# Patient Record
Sex: Male | Born: 1985 | Race: Black or African American | Hispanic: No | Marital: Single | Smoking: Current every day smoker
Health system: Southern US, Community
[De-identification: ages and names within clinical notes are randomized; demographics above are authoritative.]

## PROBLEM LIST (undated history)

## (undated) HISTORY — PX: BACK SURGERY: SHX140

---
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
## 2007-10-27 ENCOUNTER — Emergency Department: Payer: Self-pay | Admitting: Emergency Medicine

## 2008-08-18 ENCOUNTER — Emergency Department: Payer: Self-pay | Admitting: Emergency Medicine

## 2008-11-14 ENCOUNTER — Emergency Department: Payer: Self-pay | Admitting: Emergency Medicine

## 2009-01-01 ENCOUNTER — Emergency Department: Payer: Self-pay | Admitting: Emergency Medicine

## 2009-03-29 ENCOUNTER — Emergency Department: Payer: Self-pay | Admitting: Emergency Medicine

## 2009-11-04 IMAGING — CT CT HEAD WITHOUT CONTRAST
2 series · 16 of 30 positions shown, 20 images · non-contrast
Comparison: none

REASON FOR EXAM: head injury with +loc.
COMMENTS:

PROCEDURE:     CT  - CT HEAD WITHOUT CONTRAST  - October 27, 2007  [DATE]
RESULT:     Comparison: No comparison
TECHNIQUE: Multiple axial images from the foramen magnum to the vertex were
obtained without IV contrast.

[Series 2: without · axial · non-contrast · 0.44mm/px · z∈[+392,+522]mm · 13 of 32 slices shown, 17 images]
[im 3/32  brain]
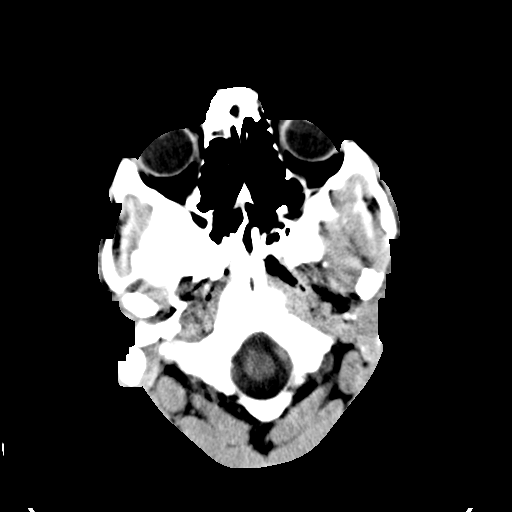
[im 3/32  bone]
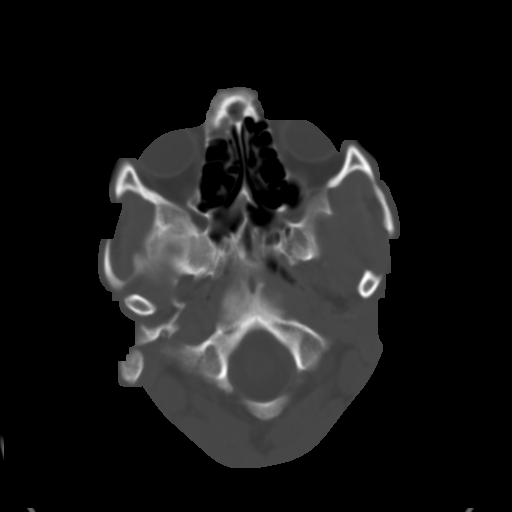
[im 5/32  brain]
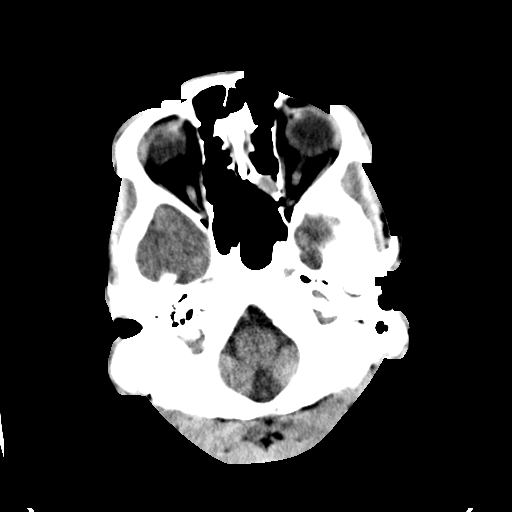
[im 7/32  brain]
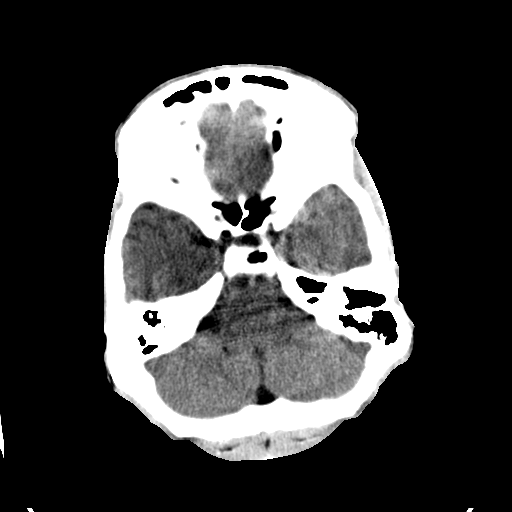
[im 9/32  brain]
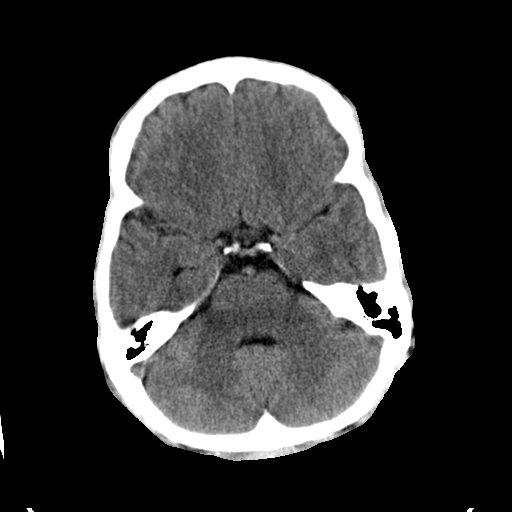
[im 12/32  brain]
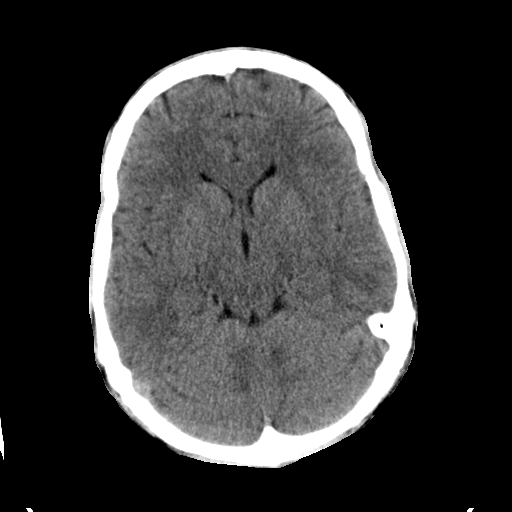
[im 12/32  bone]
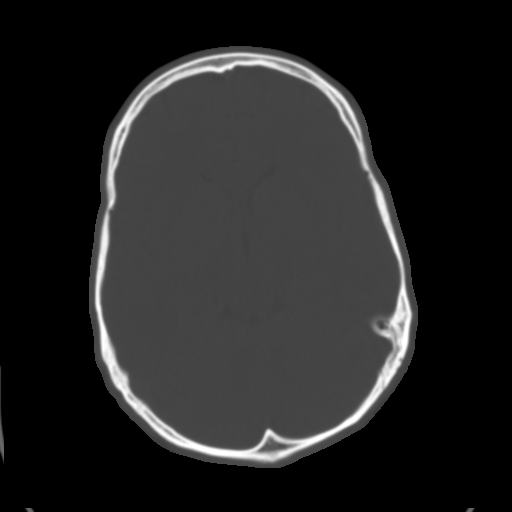
[im 14/32  brain]
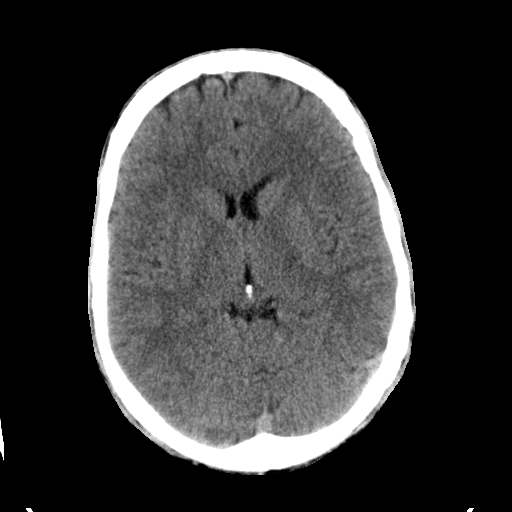
[im 16/32  brain]
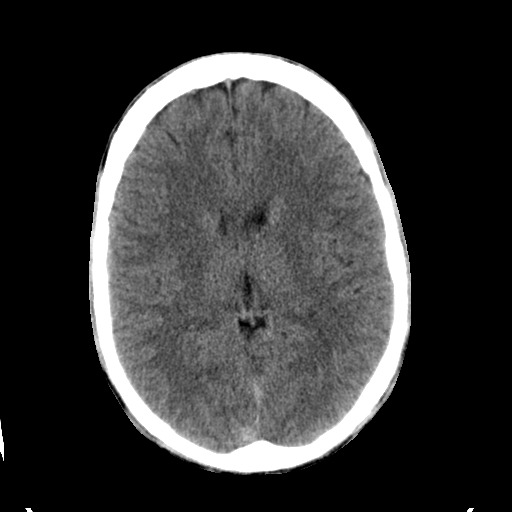
[im 18/32  brain]
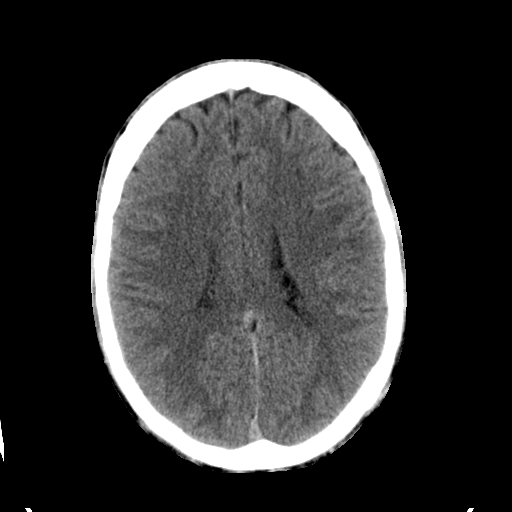
[im 20/32  brain]
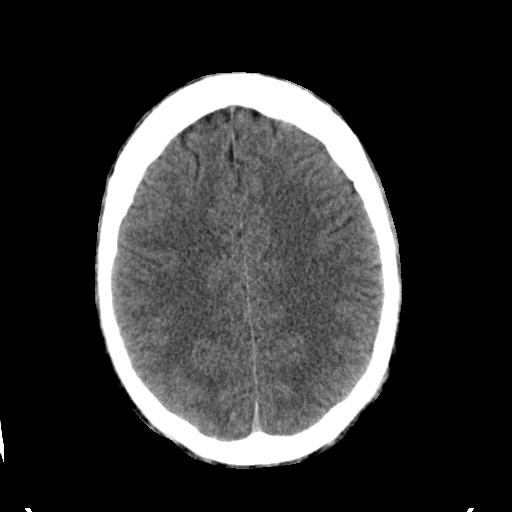
[im 20/32  bone]
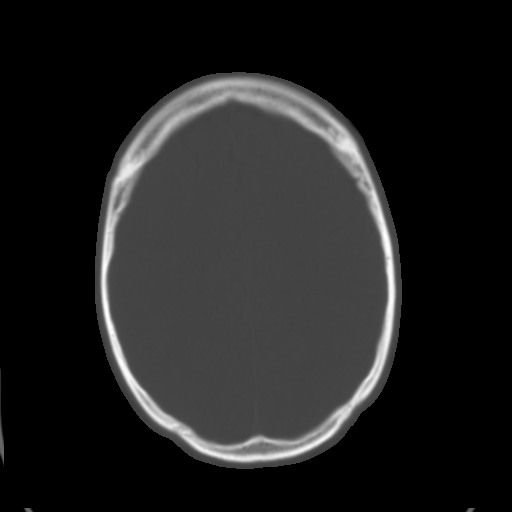
[im 23/32  brain]
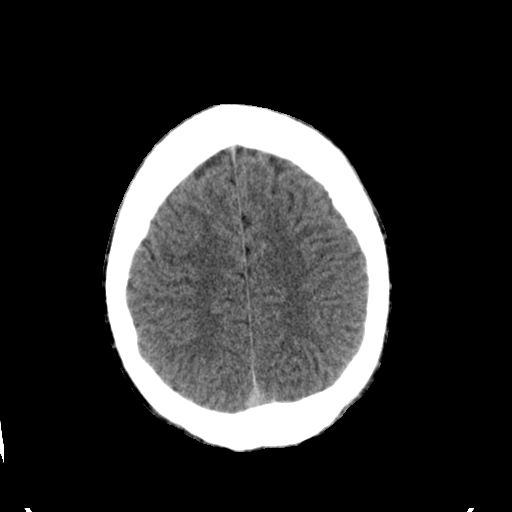
[im 25/32  brain]
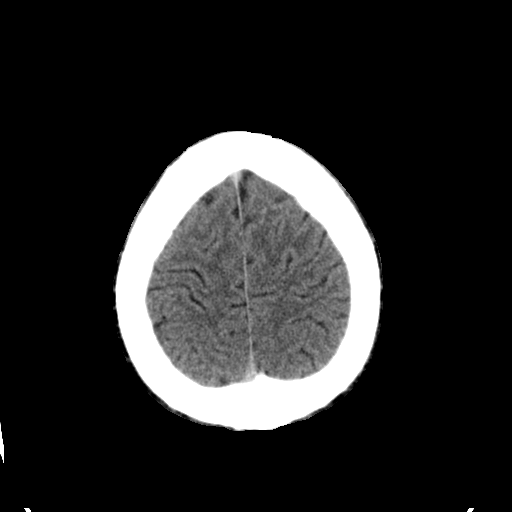
[im 27/32  brain]
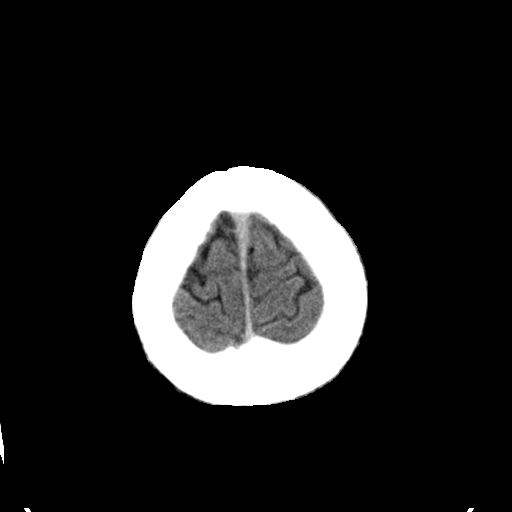
[im 29/32  brain]
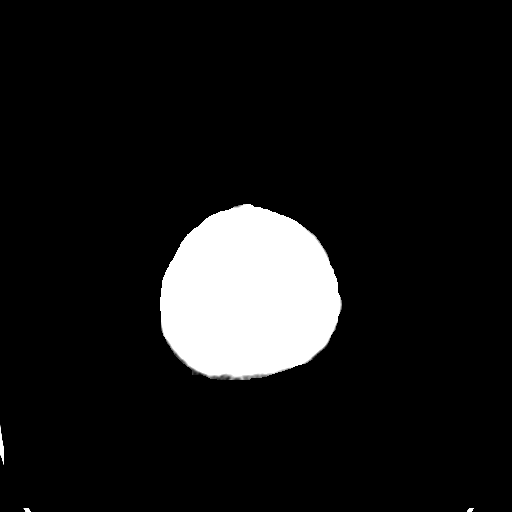
[im 29/32  bone]
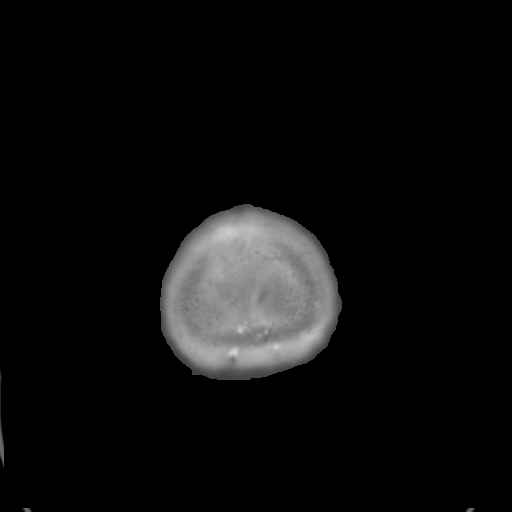

[Series 3: bone · axial · 0.44mm/px · z∈[+392,+437]mm · 3 of 32 slices shown]
[im 3/32  bone]
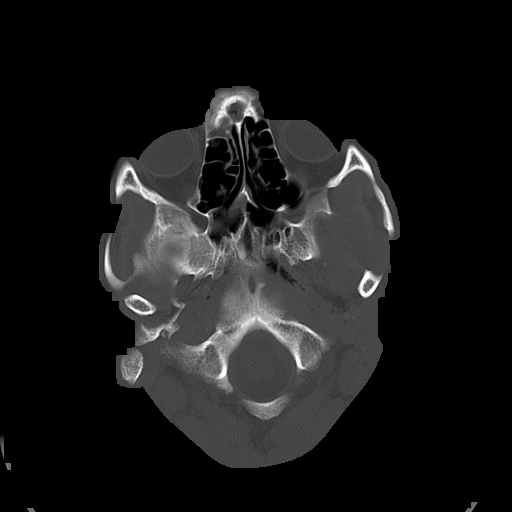
[im 7/32  bone]
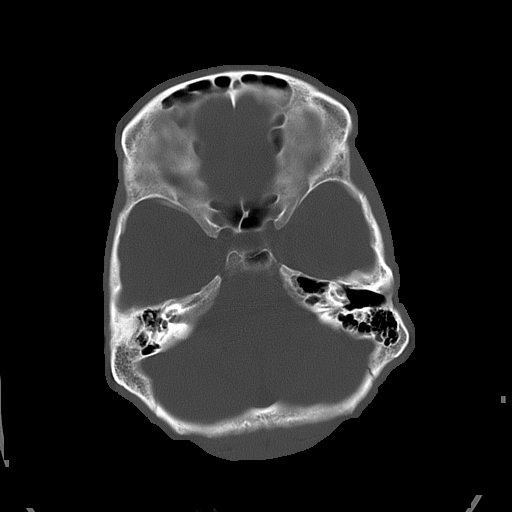
[im 12/32  bone]
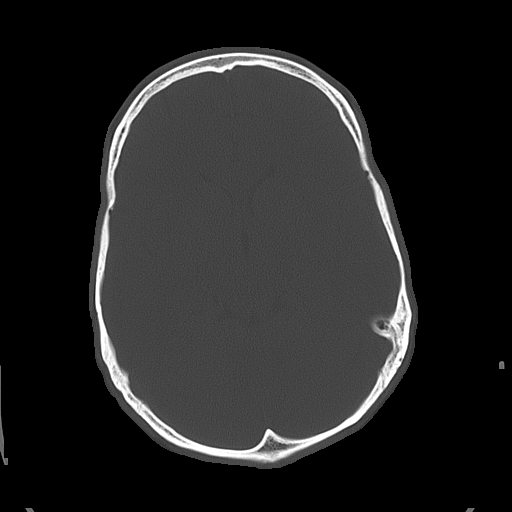

[16 of 30 positions shown; findings below may reference images not displayed]

FINDINGS: There is no evidence for mass effect, midline shift, or extra-axial fluid
collections.  There is no evidence for space-occupying lesion or
intracranial hemorrhage. There is no evidence for cortical-based area of
infarction.

Ventricles and sulci are appropriate for the patient's age. The basal
cisterns are patent.

Visualized portions of the orbits are unremarkable. The paranasal sinuses
and mastoid air cells are unremarkable.

The osseous structures are unremarkable.
IMPRESSION: No acute intracranial process.

## 2010-12-19 ENCOUNTER — Emergency Department: Payer: Self-pay | Admitting: Unknown Physician Specialty

## 2012-03-21 ENCOUNTER — Emergency Department: Payer: Self-pay | Admitting: Emergency Medicine

## 2012-03-24 LAB — BETA STREP CULTURE(ARMC)

## 2012-03-26 ENCOUNTER — Emergency Department: Payer: Self-pay | Admitting: Emergency Medicine

## 2013-11-18 LAB — HM HIV SCREENING LAB: HM HIV Screening: NEGATIVE

## 2014-01-15 ENCOUNTER — Emergency Department: Payer: Self-pay | Admitting: Internal Medicine

## 2014-05-12 ENCOUNTER — Emergency Department
Admit: 2014-05-12 | Disposition: A | Discharge status: Home / Self Care | Payer: Self-pay | Admitting: Emergency Medicine

## 2014-05-12 LAB — CBC WITH DIFFERENTIAL/PLATELET
Basophil #: 0.1 10*3/uL (ref 0.0–0.1)
Basophil %: 0.8 %
Eosinophil #: 0.1 10*3/uL (ref 0.0–0.7)
Eosinophil %: 0.9 %
HCT: 47 % (ref 40.0–52.0)
HGB: 15.1 g/dL (ref 13.0–18.0)
Lymphocyte #: 2.5 10*3/uL (ref 1.0–3.6)
Lymphocyte %: 20.3 %
MCH: 31.1 pg (ref 26.0–34.0)
MCHC: 32.2 g/dL (ref 32.0–36.0)
MCV: 97 fL (ref 80–100)
MONO ABS: 0.6 x10 3/mm (ref 0.2–1.0)
Monocyte %: 5.1 %
Neutrophil #: 9 10*3/uL — ABNORMAL HIGH (ref 1.4–6.5)
Neutrophil %: 72.9 %
PLATELETS: 249 10*3/uL (ref 150–440)
RBC: 4.86 10*6/uL (ref 4.40–5.90)
RDW: 14.3 % (ref 11.5–14.5)
WBC: 12.4 10*3/uL — ABNORMAL HIGH (ref 3.8–10.6)

## 2014-05-12 LAB — COMPREHENSIVE METABOLIC PANEL
ALK PHOS: 59 U/L
Albumin: 5.1 g/dL — ABNORMAL HIGH
Anion Gap: 6 — ABNORMAL LOW (ref 7–16)
BUN: 9 mg/dL
Bilirubin,Total: 0.5 mg/dL
CO2: 30 mmol/L
CREATININE: 1.04 mg/dL
Calcium, Total: 9.5 mg/dL
Chloride: 105 mmol/L
EGFR (Non-African Amer.): >60
Glucose: 110 mg/dL — ABNORMAL HIGH
Potassium: 4.1 mmol/L
SGOT(AST): 58 U/L — ABNORMAL HIGH
SGPT (ALT): 35 U/L
Sodium: 141 mmol/L
Total Protein: 8.1 g/dL

## 2014-05-12 LAB — LIPASE, BLOOD: Lipase: 51 U/L

## 2017-03-26 ENCOUNTER — Encounter: Payer: Self-pay | Admitting: Emergency Medicine

## 2017-03-26 ENCOUNTER — Emergency Department
Admission: EM | Admit: 2017-03-26 | Discharge: 2017-03-26 | Disposition: A | Discharge status: Home / Self Care | Payer: Self-pay | Attending: Emergency Medicine | Admitting: Emergency Medicine

## 2017-03-26 DIAGNOSIS — K2920 Alcoholic gastritis without bleeding: Secondary | ICD-10-CM

## 2017-03-26 DIAGNOSIS — K2900 Acute gastritis without bleeding: Secondary | ICD-10-CM | POA: Insufficient documentation

## 2017-03-26 LAB — URINALYSIS, COMPLETE (UACMP) WITH MICROSCOPIC
Bacteria, UA: NONE SEEN
Bilirubin Urine: NEGATIVE
Glucose, UA: NEGATIVE mg/dL
Hgb urine dipstick: NEGATIVE
Ketones, ur: NEGATIVE mg/dL
Leukocytes, UA: NEGATIVE
Nitrite: NEGATIVE
PH: 5 (ref 5.0–8.0)
Protein, ur: NEGATIVE mg/dL
SPECIFIC GRAVITY, URINE: 1.017 (ref 1.005–1.030)
SQUAMOUS EPITHELIAL / LPF: NONE SEEN

## 2017-03-26 LAB — CBC
HEMATOCRIT: 47.8 % (ref 40.0–52.0)
Hemoglobin: 16.5 g/dL (ref 13.0–18.0)
MCH: 33.4 pg (ref 26.0–34.0)
MCHC: 34.6 g/dL (ref 32.0–36.0)
MCV: 96.6 fL (ref 80.0–100.0)
PLATELETS: 190 10*3/uL (ref 150–440)
RBC: 4.95 MIL/uL (ref 4.40–5.90)
RDW: 13.8 % (ref 11.5–14.5)
WBC: 5 10*3/uL (ref 3.8–10.6)

## 2017-03-26 LAB — COMPREHENSIVE METABOLIC PANEL
ALBUMIN: 4.3 g/dL (ref 3.5–5.0)
ALT: 16 U/L — AB (ref 17–63)
AST: 28 U/L (ref 15–41)
Alkaline Phosphatase: 67 U/L (ref 38–126)
Anion gap: 8 (ref 5–15)
BILIRUBIN TOTAL: 0.6 mg/dL (ref 0.3–1.2)
BUN: 13 mg/dL (ref 6–20)
CHLORIDE: 104 mmol/L (ref 101–111)
CO2: 25 mmol/L (ref 22–32)
CREATININE: 1.04 mg/dL (ref 0.61–1.24)
Calcium: 9.1 mg/dL (ref 8.9–10.3)
GFR calc Af Amer: >60 mL/min (ref >60)
GFR calc non Af Amer: >60 mL/min (ref >60)
GLUCOSE: 89 mg/dL (ref 65–99)
POTASSIUM: 4.6 mmol/L (ref 3.5–5.1)
Sodium: 137 mmol/L (ref 135–145)
Total Protein: 7.3 g/dL (ref 6.5–8.1)

## 2017-03-26 LAB — LIPASE, BLOOD: LIPASE: 27 U/L (ref 11–51)

## 2017-03-26 MED ORDER — OMEPRAZOLE 40 MG PO CPDR: 40.0000 mg | DELAYED_RELEASE_CAPSULE | Freq: Every day | ORAL | 1 refills | Status: DC

## 2017-03-26 MED ORDER — GI COCKTAIL ~~LOC~~
30.0000 mL | Freq: Once | ORAL | Status: AC
  Administered 2017-03-26: 30 mL via ORAL
  Filled 2017-03-26: qty 30

## 2017-03-26 NOTE — ED Notes (Signed)
Pt unable to urinate at this time, given specimen cup for when is able to void and pt send back out to the lobby.

## 2017-03-26 NOTE — ED Triage Notes (Signed)
Pt reports Thursday started with vomitting and abdominal pain. Pt reports now left upper and lower abdomen is still hurting intermittently. Pt reports pain comes more often when he eats.

## 2017-03-26 NOTE — ED Provider Notes (Signed)
[facility] Emergency Department Provider Note   ____________________________________________    I have reviewed the triage vital signs and the nursing notes.   HISTORY  Chief Complaint Abdominal Pain and Emesis     HPI Phillip Barr is a 32 y.o. male who presents with complaints of abdominal pain nausea and vomiting times 4 days.  Patient reports 4 days ago he developed nausea and vomiting and upper abdominal cramping in the epigastrium.  This continued throughout the weekend however over the last 48 hours he has felt significant better.  No nausea or vomiting today.  Still some mild burning discomfort in the epigastrium.  Patient does admit to drinking "a 40 "every night.  No fevers or chills.  Normal stools.  No history of the same.  Has not taken anything for this   History reviewed. No pertinent past medical history.  There are no active problems to display for this patient.   History reviewed. No pertinent surgical history.  Prior to Admission medications   Medication Sig Start Date End Date Taking? Authorizing Provider  omeprazole (PRILOSEC) 40 MG capsule Take 1 capsule (40 mg total) by mouth daily. 03/26/17 03/26/18  Jene Every, MD     Allergies Patient has no allergy information on record.  No family history on file.  Social History Social History   Tobacco Use  . Smoking status: Not on file  Substance Use Topics  . Alcohol use: Not on file  . Drug use: Not on file    Review of Systems  Constitutional: No fever/chills Eyes: No visual changes.  ENT: No sore throat. Cardiovascular: Denies chest pain. Respiratory: Denies shortness of breath. Gastrointestinal: As above Genitourinary: Negative for dysuria. Musculoskeletal: Negative for back pain. Skin: Negative for rash. Neurological: Negative for headaches   ____________________________________________   PHYSICAL EXAM:  VITAL SIGNS: ED Triage Vitals  Enc Vitals  Group     BP 03/26/17 1145 123/73     Pulse Rate 03/26/17 1145 (!) 57     Resp 03/26/17 1145 20     Temp 03/26/17 1145 98 F (36.7 C)     Temp Source 03/26/17 1145 Oral     SpO2 03/26/17 1145 100 %     Weight 03/26/17 1146 54.4 kg (120 lb)     Height 03/26/17 1146 1.676 m (5\' 6" )     Head Circumference --      Peak Flow --      Pain Score 03/26/17 1146 5     Pain Loc --      Pain Edu? --      Excl. in GC? --     Constitutional: Alert and oriented. No acute distress. Pleasant and interactive Eyes: Conjunctivae are normal.   Nose: No congestion/rhinnorhea. Mouth/Throat: Mucous membranes are moist.    Cardiovascular: Normal rate, regular rhythm. Grossly normal heart sounds.  Good peripheral circulation. Respiratory: Normal respiratory effort.  No retractions. Lungs CTAB. Gastrointestinal: Minimal epigastric tenderness. No distention.  No CVA tenderness. Genitourinary: deferred Musculoskeletal:  Warm and well perfused Neurologic:  Normal speech and language. No gross focal neurologic deficits are appreciated.  Skin:  Skin is warm, dry and intact. No rash noted. Psychiatric: Mood and affect are normal. Speech and behavior are normal.  ____________________________________________   LABS (all labs ordered are listed, but only abnormal results are displayed)  Labs Reviewed  COMPREHENSIVE METABOLIC PANEL - Abnormal; Notable for the following components:      Result Value   ALT  16 (*)    All other components within normal limits  URINALYSIS, COMPLETE (UACMP) WITH MICROSCOPIC - Abnormal; Notable for the following components:   Color, Urine YELLOW (*)    APPearance CLEAR (*)    All other components within normal limits  LIPASE, BLOOD  CBC   ____________________________________________  EKG  None ____________________________________________  RADIOLOGY  None ____________________________________________   PROCEDURES  Procedure(s) performed:  No  Procedures   Critical Care performed: No ____________________________________________   INITIAL IMPRESSION / ASSESSMENT AND PLAN / ED COURSE  Pertinent labs & imaging results that were available during my care of the patient were reviewed by me and considered in my medical decision making (see chart for details).  Patient symptoms are consistent with gastritis versus pancreatitis, especially given continued alcohol use.  Lipase is unremarkable and patient had resolution of pain with GI cocktail.  He reports he has a strong appetite now and would like to go home.  Counseled alcohol cessation will start PPI    ____________________________________________   FINAL CLINICAL IMPRESSION(S) / ED DIAGNOSES  Final diagnoses:  Acute alcoholic gastritis without hemorrhage        Note:  This document was prepared using Dragon voice recognition software and may include unintentional dictation errors.    Jene EveryKinner, Annica Marinello, MD 03/26/17 1251

## 2017-03-26 NOTE — ED Notes (Signed)
ED Provider at bedside. 

## 2017-08-15 ENCOUNTER — Other Ambulatory Visit: Payer: Self-pay

## 2017-08-15 ENCOUNTER — Encounter: Payer: Self-pay | Admitting: Emergency Medicine

## 2017-08-15 ENCOUNTER — Emergency Department
Admission: EM | Admit: 2017-08-15 | Discharge: 2017-08-16 | Disposition: A | Discharge status: Home / Self Care | Payer: Self-pay | Attending: Emergency Medicine | Admitting: Emergency Medicine

## 2017-08-15 DIAGNOSIS — G43909 Migraine, unspecified, not intractable, without status migrainosus: Secondary | ICD-10-CM | POA: Insufficient documentation

## 2017-08-15 DIAGNOSIS — F17200 Nicotine dependence, unspecified, uncomplicated: Secondary | ICD-10-CM | POA: Insufficient documentation

## 2017-08-15 NOTE — ED Triage Notes (Signed)
Patient ambulatory to triage with steady gait, without difficulty or distress noted; pt reports frontal HA accomp by nausea x 6hrs; st hx HA as a child

## 2017-08-16 ENCOUNTER — Emergency Department: Payer: Self-pay

## 2017-08-16 LAB — COMPREHENSIVE METABOLIC PANEL
ALBUMIN: 4.1 g/dL (ref 3.5–5.0)
ALK PHOS: 50 U/L (ref 38–126)
ALT: 19 U/L (ref 0–44)
ANION GAP: 8 (ref 5–15)
AST: 23 U/L (ref 15–41)
BUN: 10 mg/dL (ref 6–20)
CALCIUM: 9 mg/dL (ref 8.9–10.3)
CHLORIDE: 108 mmol/L (ref 98–111)
CO2: 27 mmol/L (ref 22–32)
Creatinine, Ser: 0.8 mg/dL (ref 0.61–1.24)
GFR calc Af Amer: >60 mL/min (ref >60)
GFR calc non Af Amer: >60 mL/min (ref >60)
Glucose, Bld: 113 mg/dL — ABNORMAL HIGH (ref 70–99)
POTASSIUM: 3.8 mmol/L (ref 3.5–5.1)
SODIUM: 143 mmol/L (ref 135–145)
Total Bilirubin: 0.6 mg/dL (ref 0.3–1.2)
Total Protein: 7.2 g/dL (ref 6.5–8.1)

## 2017-08-16 LAB — CBC WITH DIFFERENTIAL/PLATELET
BASOS PCT: 0 %
Basophils Absolute: 0 10*3/uL (ref 0–0.1)
EOS PCT: 1 %
Eosinophils Absolute: 0.1 10*3/uL (ref 0–0.7)
HCT: 39.5 % — ABNORMAL LOW (ref 40.0–52.0)
Hemoglobin: 13.5 g/dL (ref 13.0–18.0)
LYMPHS ABS: 1.3 10*3/uL (ref 1.0–3.6)
Lymphocytes Relative: 14 %
MCH: 32.3 pg (ref 26.0–34.0)
MCHC: 34.3 g/dL (ref 32.0–36.0)
MCV: 94.1 fL (ref 80.0–100.0)
MONO ABS: 0.6 10*3/uL (ref 0.2–1.0)
MONOS PCT: 7 %
Neutro Abs: 7.2 10*3/uL — ABNORMAL HIGH (ref 1.4–6.5)
Neutrophils Relative %: 78 %
PLATELETS: 226 10*3/uL (ref 150–440)
RBC: 4.2 MIL/uL — ABNORMAL LOW (ref 4.40–5.90)
RDW: 13.8 % (ref 11.5–14.5)
WBC: 9.3 10*3/uL (ref 3.8–10.6)

## 2017-08-16 LAB — LIPASE, BLOOD: Lipase: 25 U/L (ref 11–51)

## 2017-08-16 MED ORDER — KETOROLAC TROMETHAMINE 30 MG/ML IJ SOLN
15.0000 mg | Freq: Once | INTRAMUSCULAR | Status: AC
  Administered 2017-08-16: 15 mg via INTRAVENOUS
  Filled 2017-08-16: qty 1

## 2017-08-16 MED ORDER — DIPHENHYDRAMINE HCL 50 MG/ML IJ SOLN
12.5000 mg | INTRAMUSCULAR | Status: AC
  Administered 2017-08-16: 12.5 mg via INTRAVENOUS
  Filled 2017-08-16: qty 1

## 2017-08-16 MED ORDER — METOCLOPRAMIDE HCL 5 MG/ML IJ SOLN
10.0000 mg | INTRAMUSCULAR | Status: AC
Start: 2017-08-16 — End: 2017-08-16
  Administered 2017-08-16: 10 mg via INTRAVENOUS
  Filled 2017-08-16: qty 2

## 2017-08-16 MED ORDER — SODIUM CHLORIDE 0.9 % IV BOLUS
1000.0000 mL | Freq: Once | INTRAVENOUS | Status: AC
  Administered 2017-08-16: 1000 mL via INTRAVENOUS

## 2017-08-16 MED ORDER — DEXAMETHASONE SODIUM PHOSPHATE 10 MG/ML IJ SOLN
10.0000 mg | Freq: Once | INTRAMUSCULAR | Status: AC
  Administered 2017-08-16: 10 mg via INTRAVENOUS
  Filled 2017-08-16: qty 1

## 2017-08-16 NOTE — Discharge Instructions (Signed)

## 2017-08-16 NOTE — ED Provider Notes (Signed)
[facility] Emergency Department Provider Note  ____________________________________________   First MD Initiated Contact with Patient 08/15/17 2343     (approximate)  I have reviewed the triage vital signs and the nursing notes.   HISTORY  Chief Complaint Headache    HPI Phillip Barr is a 32 y.o. male who reports no chronic history and presents by private vehicle for evaluation of acute onset severe frontal headache that is sharp and stabbing and throbbing accompanied with persistent nausea.  The symptoms started about 6 hours prior to arrival.  He reports that he took a nap this afternoon and felt fine before him but he awoke with the symptoms as described.  He reports numerous episodes of vomiting since awakening, at least a dozen.  He is not having any abdominal pain.  He has no numbness nor weakness in any of his extremities, no difficulty with ambulation, no difficulty with word finding, no facial droop.  He reports having migraines that feels similar to this but milder when he was a child but has not had any issues for years.  He denies recent fever/chills, chest pain, shortness of breath, abdominal pain, and dysuria.  He smokes a small amount of cigarettes every day and marijuana, no other drugs, rare alcohol.  No recent traumatic injuries to the head.  Light makes his headache worse and he is keeping his eyes closed tightly during most of the interview.  No neck pain nor stiffness.  History reviewed. No pertinent past medical history.  There are no active problems to display for this patient.   Past Surgical History:  Procedure Laterality Date  . BACK SURGERY      Prior to Admission medications   Medication Sig Start Date End Date Taking? Authorizing Provider  omeprazole (PRILOSEC) 40 MG capsule Take 1 capsule (40 mg total) by mouth daily. 03/26/17 03/26/18  Jene EveryKinner, Robert, MD    Allergies Patient has no known allergies.  No family history on  file.  Social History Social History   Tobacco Use  . Smoking status: Current Every Day Smoker  . Smokeless tobacco: Never Used  Substance Use Topics  . Alcohol use: Yes  . Drug use: Not on file    Review of Systems Constitutional: No fever/chills Eyes: Photophobia. ENT: No sore throat. Cardiovascular: Denies chest pain. Respiratory: Denies shortness of breath. Gastrointestinal: No abdominal pain.  Acute onset severe nausea. Genitourinary: Negative for dysuria. Musculoskeletal: Negative for neck pain.  Negative for back pain. Integumentary: Negative for rash. Neurological: Severe frontal headache as described above.  No focal numbness nor weakness.   ____________________________________________   PHYSICAL EXAM:  VITAL SIGNS: ED Triage Vitals [08/15/17 2302]  Enc Vitals Group     BP 134/84     Pulse Rate 77     Resp 18     Temp 98 F (36.7 C)     Temp Source Oral     SpO2 97 %     Weight 56.7 kg (125 lb)     Height 1.702 m (5\' 7" )     Head Circumference      Peak Flow      Pain Score 10     Pain Loc      Pain Edu?      Excl. in GC?     Constitutional: Alert and oriented.  Appears uncomfortable but is not in severe distress. Eyes: Conjunctivae are normal.  Mild anisocoria with left pupil smaller than the right, but both are  reactive to light. Head: Atraumatic. Nose: No congestion/rhinnorhea. Mouth/Throat: Mucous membranes are moist. Neck: No stridor.  No meningeal signs.   Cardiovascular: Normal rate, regular rhythm. Good peripheral circulation. Grossly normal heart sounds. Respiratory: Normal respiratory effort.  No retractions. Lungs CTAB. Gastrointestinal: Soft and nontender. No distention.  Musculoskeletal: No lower extremity tenderness nor edema. No gross deformities of extremities. Neurologic:  Normal speech and language. No gross focal neurologic deficits are appreciated.  Patient has good muscle strength throughout his extremities and no evidence of  any cerebellar dysfunction.  No evidence of visual droop. Skin:  Skin is warm, dry and intact. No rash noted. Psychiatric: Mood and affect are normal. Speech and behavior are normal.  ____________________________________________   LABS (all labs ordered are listed, but only abnormal results are displayed)  Labs Reviewed  CBC WITH DIFFERENTIAL/PLATELET - Abnormal; Notable for the following components:      Result Value   RBC 4.20 (*)    HCT 39.5 (*)    Neutro Abs 7.2 (*)    All other components within normal limits  COMPREHENSIVE METABOLIC PANEL - Abnormal; Notable for the following components:   Glucose, Bld 113 (*)    All other components within normal limits  LIPASE, BLOOD   ____________________________________________  EKG  None - EKG not ordered by ED physician ____________________________________________  RADIOLOGY   ED MD interpretation:  No acute intracranial abnormalities  Official radiology report(s): Ct Head Wo Contrast  Result Date: 08/16/2017 CLINICAL DATA:  Worst headache of life EXAM: CT HEAD WITHOUT CONTRAST TECHNIQUE: Contiguous axial images were obtained from the base of the skull through the vertex without intravenous contrast. COMPARISON:  Head CT 10/27/2007 FINDINGS: Brain: There is no mass, hemorrhage or extra-axial collection. The size and configuration of the ventricles and extra-axial CSF spaces are normal. There is no acute or chronic infarction. The brain parenchyma is normal. Vascular: No abnormal hyperdensity of the major intracranial arteries or dural venous sinuses. No intracranial atherosclerosis. Skull: The visualized skull base, calvarium and extracranial soft tissues are normal. Sinuses/Orbits: No fluid levels or advanced mucosal thickening of the visualized paranasal sinuses. No mastoid or middle ear effusion. The orbits are normal. IMPRESSION: Normal head CT. Electronically Signed   By: Deatra Robinson M.D.   On: 08/16/2017 01:08     ____________________________________________   PROCEDURES  Critical Care performed: No   Procedure(s) performed:   Procedures   ____________________________________________   INITIAL IMPRESSION / ASSESSMENT AND PLAN / ED COURSE  As part of my medical decision making, I reviewed the following data within the electronic MEDICAL RECORD NUMBER Nursing notes reviewed and incorporated, Labs reviewed , Old chart reviewed and Notes from prior ED visits    Differential diagnosis includes, but is not limited to, migraine headache, cluster headache, nonspecific generalized headache, infectious process including but not limited to meningitis or encephalitis, metabolic or electrolyte abnormality, nontraumatic subarachnoid hemorrhage.  Given the patient's mild anisocoria and the acute onset of the symptoms, I think it is reasonable to check a CT head without contrast.  However most likely patient is suffering from a migraine.  I will begin treatment with 1 L normal saline IV, metoclopramide 10 mg IV, Benadryl 12.5 mg IV.  Once I have a normal head CT I will give him Toradol 15 mg IV and reassess.  I told the patient the plan and explained for what I am evaluating and that hopefully he will be able to go home if everything looks normal and he is feeling  better.  He agrees with the plan.  I am also checking basic lab work including hepatic function panel and lipase given that he is having severe nausea, but fortunately has no tenderness to palpation of the abdomen I think it is very unlikely he is suffering from biliary colic or an SBO/ileus.   Clinical Course as of Aug 16 404  Thu Aug 16, 2017  0155 Normal head CT with no evidence of acute bleeding.  Labs are all within normal limits.  I am giving Toradol 50 mg IV and Decadron 10 mg IV to help with the migraine symptoms  CT Head Wo Contrast [CF]  0405 Headache is a 0 and the patient is ready to go home.  I gave my usual and customary return  precautions.   [CF]    Clinical Course User Index [CF] Loleta Rose, MD    ____________________________________________  FINAL CLINICAL IMPRESSION(S) / ED DIAGNOSES  Final diagnoses:  Migraine without status migrainosus, not intractable, unspecified migraine type     MEDICATIONS GIVEN DURING THIS VISIT:  Medications  metoCLOPramide (REGLAN) injection 10 mg (10 mg Intravenous Given 08/16/17 0133)  diphenhydrAMINE (BENADRYL) injection 12.5 mg (12.5 mg Intravenous Given 08/16/17 0133)  sodium chloride 0.9 % bolus 1,000 mL (0 mLs Intravenous Stopped 08/16/17 0250)  ketorolac (TORADOL) 30 MG/ML injection 15 mg (15 mg Intravenous Given 08/16/17 0214)  dexamethasone (DECADRON) injection 10 mg (10 mg Intravenous Given 08/16/17 0214)     ED Discharge Orders    None       Note:  This document was prepared using Dragon voice recognition software and may include unintentional dictation errors.    Loleta Rose, MD 08/16/17 [phone]

## 2018-09-25 ENCOUNTER — Other Ambulatory Visit: Payer: Self-pay

## 2018-09-25 ENCOUNTER — Ambulatory Visit: Payer: Self-pay | Admitting: Physician Assistant

## 2018-09-25 DIAGNOSIS — Z113 Encounter for screening for infections with a predominantly sexual mode of transmission: Secondary | ICD-10-CM

## 2018-09-25 LAB — GRAM STAIN

## 2018-09-26 ENCOUNTER — Encounter: Payer: Self-pay | Admitting: Physician Assistant

## 2018-09-26 NOTE — Progress Notes (Signed)
    STI clinic/screening visit  Subjective:  Phillip Barr is a 33 y.o. male being seen today for an STI screening visit. The patient reports they do not have symptoms.  Patient has the following medical conditions:  There are no active problems to display for this patient.    Chief Complaint  Patient presents with  . SEXUALLY TRANSMITTED DISEASE    HPI  Patient reports that he would like a screening.  Denies any symptoms today.    See flowsheet for further details and programmatic requirements.    The following portions of the patient's history were reviewed and updated as appropriate: allergies, current medications, past medical history, past social history, past surgical history and problem list.  Objective:  There were no vitals filed for this visit.  Physical Exam Constitutional:      General: He is not in acute distress.    Appearance: Normal appearance.  HENT:     Head: Normocephalic and atraumatic.     Mouth/Throat:     Mouth: Mucous membranes are moist.     Pharynx: Oropharynx is clear. No oropharyngeal exudate or posterior oropharyngeal erythema.  Eyes:     Conjunctiva/sclera: Conjunctivae normal.  Neck:     Musculoskeletal: Neck supple.  Pulmonary:     Effort: Pulmonary effort is normal.  Abdominal:     Palpations: Abdomen is soft. There is no mass.     Tenderness: There is no abdominal tenderness. There is no guarding or rebound.  Genitourinary:    Penis: Normal.      Scrotum/Testes: Normal.     Comments: Pubic area without nits, lice, edema, erythema, lesions and inguinal adenopathy. Lymphadenopathy:     Cervical: No cervical adenopathy.  Skin:    General: Skin is warm and dry.     Findings: No bruising, erythema, lesion or rash.  Neurological:     Mental Status: He is alert and oriented to person, place, and time.  Psychiatric:        Mood and Affect: Mood normal.        Behavior: Behavior normal.        Thought Content: Thought content  normal.        Judgment: Judgment normal.       Assessment and Plan:  Phillip Barr is a 33 y.o. male presenting to the [facility] Department for STI screening  1. Screening for STD (sexually transmitted disease) Patient is without symptoms today. Rec condoms with all sex Await test results.  Counseled that RN will call if needs to RTC for treatment once results are back.  - Gram stain - Gonococcus culture - HIV [facility] - Syphilis Serology, [facility] - Gonococcus culture     No follow-ups on file.  No future appointments.  Jerene Dilling, PA

## 2018-09-30 LAB — GONOCOCCUS CULTURE

## 2019-02-13 ENCOUNTER — Ambulatory Visit: Payer: Self-pay | Admitting: Physician Assistant

## 2019-02-13 ENCOUNTER — Other Ambulatory Visit: Payer: Self-pay

## 2019-02-13 ENCOUNTER — Encounter: Payer: Self-pay | Admitting: Physician Assistant

## 2019-02-13 DIAGNOSIS — Z113 Encounter for screening for infections with a predominantly sexual mode of transmission: Secondary | ICD-10-CM

## 2019-02-13 DIAGNOSIS — Z202 Contact with and (suspected) exposure to infections with a predominantly sexual mode of transmission: Secondary | ICD-10-CM

## 2019-02-13 LAB — GRAM STAIN

## 2019-02-13 MED ORDER — AZITHROMYCIN 500 MG PO TABS
1000.0000 mg | ORAL_TABLET | Freq: Once | ORAL | Status: AC
  Administered 2019-02-13: 12:00:00 1000 mg via ORAL

## 2019-02-13 NOTE — Progress Notes (Signed)
Pt is here for STD screening. Pt reports he is a contact to Chlamydia.Lyman Speller, RN

## 2019-02-13 NOTE — Progress Notes (Signed)
Gram stain reviewed and is negative today. Pt treated as a contact to Chlamydia per Sadie Haber, PA order. Provider orders completed.Lyman Speller, RN

## 2019-02-15 NOTE — Progress Notes (Signed)
   [facility] Department STI clinic/screening visit  Subjective:  Phillip Barr is a 34 y.o. male being seen today for an STI screening visit. The patient reports they do have symptoms.    Patient has the following medical conditions:  There are no problems to display for this patient.    Chief Complaint  Patient presents with  . SEXUALLY TRANSMITTED DISEASE    STD screening and contact to Chlamydia    HPI  Patient reports that he is a contact to Chlamydia.  States that he has had slight dysuria for a week off and on.  Denies other symptoms.   See flowsheet for further details and programmatic requirements.    The following portions of the patient's history were reviewed and updated as appropriate: allergies, current medications, past medical history, past social history, past surgical history and problem list.  Objective:  There were no vitals filed for this visit.  Physical Exam Constitutional:      General: He is not in acute distress.    Appearance: Normal appearance. He is normal weight.  HENT:     Head: Normocephalic and atraumatic.     Comments: No nits, lice, or hair loss. No cervical, supraclavicular or axillary adenopathy.    Mouth/Throat:     Mouth: Mucous membranes are moist.     Pharynx: Oropharynx is clear. No oropharyngeal exudate or posterior oropharyngeal erythema.  Eyes:     Conjunctiva/sclera: Conjunctivae normal.  Pulmonary:     Effort: Pulmonary effort is normal.  Abdominal:     Palpations: Abdomen is soft. There is no mass.     Tenderness: There is no abdominal tenderness. There is no guarding or rebound.  Genitourinary:    Penis: Normal.      Testes: Normal.     Comments: Pubic area without nits, lice, edema, erythema, lesions and inguinal adenopathy. Penis circumcised, without lesions, rashes, and discharge from urethral meatus. Musculoskeletal:     Cervical back: Neck supple. No tenderness.  Skin:    General: Skin is warm  and dry.     Findings: No bruising, erythema, lesion or rash.  Neurological:     Mental Status: He is alert and oriented to person, place, and time.  Psychiatric:        Mood and Affect: Mood normal.        Behavior: Behavior normal.        Thought Content: Thought content normal.        Judgment: Judgment normal.       Assessment and Plan:  Phillip Barr is a 34 y.o. male presenting to the [facility] Department for STI screening  1. Screening for STD (sexually transmitted disease) Patient into clinic with symptoms. Rec condoms with all sex. Await test results.  Counseled that RN will call if needs to RTC for further treatment once results are back. - Gram stain - Gonococcus culture - HIV/HCV [facility] - Syphilis Serology, [facility]  2. Chlamydia contact Treat as a contact to Chlamydia with Azithromycin 1g po DOT. No sex for 7 days and until after partner completes treatment. RTC for re-treatment if vomits < 2 hr after taking medicine. - azithromycin (ZITHROMAX) tablet 1,000 mg     Return if symptoms worsen or fail to improve.  No future appointments.  Matt Holmes, PA

## 2019-02-18 LAB — GONOCOCCUS CULTURE

## 2019-02-19 LAB — HM HIV SCREENING LAB: HM HIV Screening: NEGATIVE

## 2019-02-19 LAB — HM HEPATITIS C SCREENING LAB: HM Hepatitis Screen: NEGATIVE

## 2019-05-05 ENCOUNTER — Emergency Department: Admission: EM | Admit: 2019-05-05 | Discharge: 2019-05-05 | Discharge status: Against Medical Advice | Payer: Self-pay

## 2019-05-05 NOTE — ED Notes (Signed)
Called for patient without answer to triage

## 2019-05-05 NOTE — ED Notes (Signed)
PT called for triage with no response

## 2019-05-05 NOTE — ED Triage Notes (Signed)
Pt called for triage no response ?

## 2019-05-30 ENCOUNTER — Ambulatory Visit: Payer: Self-pay | Admitting: Physician Assistant

## 2019-05-30 ENCOUNTER — Other Ambulatory Visit: Payer: Self-pay

## 2019-05-30 DIAGNOSIS — Z113 Encounter for screening for infections with a predominantly sexual mode of transmission: Secondary | ICD-10-CM

## 2019-05-30 DIAGNOSIS — Z792 Long term (current) use of antibiotics: Secondary | ICD-10-CM

## 2019-05-30 LAB — GRAM STAIN

## 2019-05-30 MED ORDER — AZITHROMYCIN 500 MG PO TABS
1000.0000 mg | ORAL_TABLET | Freq: Once | ORAL | Status: AC
Start: 2019-05-30 — End: 2019-05-30
  Administered 2019-05-30: 1000 mg via ORAL

## 2019-05-30 NOTE — Progress Notes (Signed)
Gram stain reviewed by provider, pt treated per provider orders. Provider orders completed.

## 2019-05-31 ENCOUNTER — Encounter: Payer: Self-pay | Admitting: Physician Assistant

## 2019-05-31 NOTE — Progress Notes (Signed)
   [facility] Department STI clinic/screening visit  Subjective:  Phillip Barr is a 34 y.o. male being seen today for an STI screening visit. The patient reports they do have symptoms.    Patient has the following medical conditions:  There are no problems to display for this patient.    Chief Complaint  Patient presents with  . SEXUALLY TRANSMITTED DISEASE    screening including bloodwork    HPI  Patient reports that he has had a "burning/tingling" inside his urethra for 2 days.  Denies other symptoms.  Denies chronic conditions and regular medications.  Reports "scoliosis surgery" at age 85.    See flowsheet for further details and programmatic requirements.    The following portions of the patient's history were reviewed and updated as appropriate: allergies, current medications, past medical history, past social history, past surgical history and problem list.  Objective:  There were no vitals filed for this visit.  Physical Exam Constitutional:      General: He is not in acute distress.    Appearance: Normal appearance. He is normal weight.  HENT:     Head: Normocephalic and atraumatic.     Comments: No nits, lice, or hair loss. No cervical, supraclavicular or axillary adenopathy.    Mouth/Throat:     Mouth: Mucous membranes are moist.     Pharynx: Oropharynx is clear. No oropharyngeal exudate or posterior oropharyngeal erythema.  Eyes:     Conjunctiva/sclera: Conjunctivae normal.  Pulmonary:     Effort: Pulmonary effort is normal.  Abdominal:     Palpations: Abdomen is soft. There is no mass.     Tenderness: There is no abdominal tenderness. There is no guarding or rebound.  Genitourinary:    Penis: Normal.      Testes: Normal.     Comments: Pubic area without nits, lice, edema, erythema, lesions and inguinal adenopathy. Penis circumcised and without rash, lesions and discharge from meatus.  Musculoskeletal:     Cervical back: Neck supple.  No tenderness.  Skin:    General: Skin is warm and dry.     Findings: No bruising, erythema, lesion or rash.  Neurological:     Mental Status: He is alert and oriented to person, place, and time.  Psychiatric:        Mood and Affect: Mood normal.        Behavior: Behavior normal.        Thought Content: Thought content normal.        Judgment: Judgment normal.       Assessment and Plan:  Phillip Barr is a 34 y.o. male presenting to the [facility] Department for STI screening  1. Screening for STD (sexually transmitted disease) Patient into clinic with symptoms. Rec condoms with all sex. Await test results.  Counseled that RN will call if needs to RTC for further treatment once results are back.  - Gram stain - Gonococcus culture - HIV [facility] - Syphilis Serology, [facility] - Gonococcus culture  2. Prophylactic antibiotic Will treat due to symptoms and risks with Azithromycin 1 g po DOT today. No sex for 7 days and until after partner completes treatment. RTC for re-treatment if vomits < 2 hr after taking medicine. - azithromycin (ZITHROMAX) tablet 1,000 mg     No follow-ups on file.  No future appointments.  Matt Holmes, PA

## 2019-06-03 LAB — GONOCOCCUS CULTURE

## 2019-10-29 ENCOUNTER — Ambulatory Visit: Payer: Self-pay

## 2020-01-21 ENCOUNTER — Encounter: Payer: Self-pay | Admitting: Physician Assistant

## 2020-01-21 ENCOUNTER — Other Ambulatory Visit: Payer: Self-pay

## 2020-01-21 DIAGNOSIS — Z20822 Contact with and (suspected) exposure to covid-19: Secondary | ICD-10-CM

## 2020-01-23 LAB — NOVEL CORONAVIRUS, NAA: SARS-CoV-2, NAA: NOT DETECTED

## 2020-01-23 LAB — SARS-COV-2, NAA 2 DAY TAT

## 2020-05-31 ENCOUNTER — Emergency Department
Admission: EM | Admit: 2020-05-31 | Discharge: 2020-05-31 | Disposition: A | Discharge status: Home / Self Care | Payer: Self-pay | Attending: Emergency Medicine | Admitting: Emergency Medicine

## 2020-05-31 ENCOUNTER — Other Ambulatory Visit: Payer: Self-pay

## 2020-05-31 ENCOUNTER — Encounter: Payer: Self-pay | Admitting: *Deleted

## 2020-05-31 DIAGNOSIS — J069 Acute upper respiratory infection, unspecified: Secondary | ICD-10-CM | POA: Insufficient documentation

## 2020-05-31 DIAGNOSIS — Z87891 Personal history of nicotine dependence: Secondary | ICD-10-CM | POA: Insufficient documentation

## 2020-05-31 DIAGNOSIS — Z28310 Unvaccinated for covid-19: Secondary | ICD-10-CM | POA: Insufficient documentation

## 2020-05-31 DIAGNOSIS — Z20822 Contact with and (suspected) exposure to covid-19: Secondary | ICD-10-CM | POA: Insufficient documentation

## 2020-05-31 DIAGNOSIS — Z8616 Personal history of COVID-19: Secondary | ICD-10-CM | POA: Insufficient documentation

## 2020-05-31 LAB — RESP PANEL BY RT-PCR (FLU A&B, COVID) ARPGX2
Influenza A by PCR: NEGATIVE
Influenza B by PCR: NEGATIVE
SARS Coronavirus 2 by RT PCR: NEGATIVE

## 2020-05-31 MED ORDER — CETIRIZINE HCL 5 MG PO TABS: 5.0000 mg | ORAL_TABLET | Freq: Every day | ORAL | 0 refills | Status: AC

## 2020-05-31 MED ORDER — ONDANSETRON 4 MG PO TBDP: 4.0000 mg | ORAL_TABLET | Freq: Three times a day (TID) | ORAL | 0 refills | Status: DC | PRN

## 2020-05-31 MED ORDER — BENZONATATE 100 MG PO CAPS: ORAL_CAPSULE | ORAL | 0 refills | Status: DC

## 2020-05-31 NOTE — ED Notes (Signed)
See triage note  Presents with cough and some congestion for couple of days    Also has a h/a  No fever

## 2020-05-31 NOTE — ED Triage Notes (Signed)
Pt has a cough, headache, congestion.  Sx for 1 day.   Pt alert  Speech clear.

## 2020-05-31 NOTE — Discharge Instructions (Signed)
Your exam is normal at this time. Take the prescription meds as directed. Follow-up with [facility] as needed.

## 2020-05-31 NOTE — ED Provider Notes (Signed)
[facility] Emergency Department Provider Note ____________________________________________  Time seen: 1730  I have reviewed the triage vital signs and the nursing notes.  HISTORY  Chief Complaint  Cough   HPI Phillip Barr is a 35 y.o. male presents himself to the ED for evaluation of 1 week complaint of cough and congestion as well as some intermittent headache.  Patient denies any frank fevers, but has noted some intermittent chills and night sweats.  Today he experienced 2 episodes of nonbloody, nonbilious emesis after eating.  He denies any ongoing abdominal pain, but is also had some intermittent diarrhea in the last week.  Patient denies any sick contacts, recent travel, or bad food exposure.  He does confirm he was diagnosed with COVID in January.  He has not subsequently received a COVID and or flu vaccine.   No past medical history on file.  There are no problems to display for this patient.   Past Surgical History:  Procedure Laterality Date  . BACK SURGERY      Prior to Admission medications   Medication Sig Start Date End Date Taking? Authorizing Provider  benzonatate (TESSALON PERLES) 100 MG capsule Take 1-2 tabs TID prn cough 05/31/20  Yes Bryson Gavia, Charlesetta Ivory, PA-C  cetirizine (ZYRTEC) 5 MG tablet Take 1 tablet (5 mg total) by mouth daily for 15 days. 05/31/20 06/15/20 Yes Taline Nass, Charlesetta Ivory, PA-C  ondansetron (ZOFRAN ODT) 4 MG disintegrating tablet Take 1 tablet (4 mg total) by mouth every 8 (eight) hours as needed. 05/31/20  Yes Jeanean Hollett, Charlesetta Ivory, PA-C  omeprazole (PRILOSEC) 40 MG capsule Take 1 capsule (40 mg total) by mouth daily. 03/26/17 03/26/18  Jene Every, MD    Allergies Patient has no known allergies.  No family history on file.  Social History Social History   Tobacco Use  . Smoking status: Former Games developer  . Smokeless tobacco: Never Used  . Tobacco comment: quit about 2 years ago  Substance Use Topics  .  Alcohol use: Yes  . Drug use: Not Currently    Types: Marijuana    Review of Systems  Constitutional: Negative for fever. Eyes: Negative for visual changes. ENT: Negative for sore throat. Cardiovascular: Negative for chest pain. Respiratory: Negative for shortness of breath. Gastrointestinal: Negative for abdominal pain, vomiting and diarrhea. Genitourinary: Negative for dysuria. Musculoskeletal: Negative for back pain. Skin: Negative for rash. Neurological: Negative for headaches, focal weakness or numbness. ____________________________________________  PHYSICAL EXAM:  VITAL SIGNS: ED Triage Vitals  Enc Vitals Group     BP 05/31/20 1607 122/74     Pulse Rate 05/31/20 1607 70     Resp 05/31/20 1607 16     Temp 05/31/20 1607 98.3 F (36.8 C)     Temp Source 05/31/20 1607 Oral     SpO2 05/31/20 1607 98 %     Weight 05/31/20 1611 115 lb (52.2 kg)     Height 05/31/20 1611 5\' 6"  (1.676 m)     Head Circumference --      Peak Flow --      Pain Score 05/31/20 1611 4     Pain Loc --      Pain Edu? --      Excl. in GC? --     Constitutional: Alert and oriented. Well appearing and in no distress. Head: Normocephalic and atraumatic. Eyes: Conjunctivae are normal. PERRL. Normal extraocular movements Ears: Canals clear. TMs intact bilaterally. Nose: No congestion/rhinorrhea/epistaxis. Mouth/Throat: Mucous membranes are moist. Neck:  Supple. No thyromegaly. Hematological/Lymphatic/Immunological: No cervical lymphadenopathy. Cardiovascular: Normal rate, regular rhythm. Normal distal pulses. Respiratory: Normal respiratory effort. No wheezes/rales/rhonchi. Gastrointestinal: Soft and nontender. No distention. Musculoskeletal: Nontender with normal range of motion in all extremities.  Neurologic:  Normal gait without ataxia. Normal speech and language. No gross focal neurologic deficits are appreciated. Skin:  Skin is warm, dry and intact. No rash noted. Psychiatric: Mood and  affect are normal. Patient exhibits appropriate insight and judgment. ____________________________________________   LABS (pertinent positives/negatives) Labs Reviewed  RESP PANEL BY RT-PCR (FLU A&B, COVID) [id number]  ____________________________________________  PROCEDURES  Procedures ____________________________________________   INITIAL IMPRESSION / ASSESSMENT AND PLAN / ED COURSE  As part of my medical decision making, I reviewed the following data within the electronic MEDICAL RECORD NUMBER Notes from prior ED visits      Patient ED evaluation of now resolved nausea and vomiting, as well as some intermittent cough and congestion.  Patient exam is overall benign reassurance time.  Vital signs are stable as he has no signs of acute respiratory stress, tachycardia, tachypnea, or hypoxia.  Patient's exam is reassuring as it also shows no signs of an acute illness at this time.  Clinically the patient is stable no symptoms may represent a viral etiology.  He has agreed to viral panel screen, which is pending at the time of his disposition.  Patient is otherwise stable for discharge at this time.  He will be discharged with instructions to take the prescription Tessalon Perles, cetirizine, and ondansetron as needed.  He will follow-up with his primary provider or return to the ED if necessary.    Phillip Barr was evaluated in Emergency Department on 06/01/2020 for the symptoms described in the history of present illness. He was evaluated in the context of the global COVID-19 pandemic, which necessitated consideration that the patient might be at risk for infection with the SARS-CoV-2 virus that causes COVID-19. Institutional protocols and algorithms that pertain to the evaluation of patients at risk for COVID-19 are in a state of rapid change based on information released by regulatory bodies including the CDC and federal and state organizations. These policies and algorithms were followed during the  patient's care in the ED. ____________________________________________  FINAL CLINICAL IMPRESSION(S) / ED DIAGNOSES  Final diagnoses:  Viral URI with cough      Phillip Barr, Charlesetta Ivory, PA-C 06/01/20 Corene Cornea, MD 06/01/20 1513

## 2020-07-15 ENCOUNTER — Encounter: Payer: Self-pay | Admitting: Family Medicine

## 2020-07-15 ENCOUNTER — Ambulatory Visit: Payer: Self-pay | Admitting: Family Medicine

## 2020-07-15 ENCOUNTER — Other Ambulatory Visit: Payer: Self-pay

## 2020-07-15 DIAGNOSIS — Z113 Encounter for screening for infections with a predominantly sexual mode of transmission: Secondary | ICD-10-CM

## 2020-07-15 LAB — GRAM STAIN

## 2020-07-15 NOTE — Progress Notes (Signed)
Pt here for STD screening.  Gram Stain results reviewed. No treatment required.  Pt given condoms. Berdie Ogren, RN

## 2020-07-15 NOTE — Progress Notes (Signed)
   [facility] Department STI clinic/screening visit  Subjective:  Phillip Barr is a 35 y.o. male being seen today for an STI screening visit. The patient reports they do not have symptoms.    Patient has the following medical conditions:  There are no problems to display for this patient.    Chief Complaint  Patient presents with   SEXUALLY TRANSMITTED DISEASE    Screening    HPI  Patient reports here for screening, denies s/sx   See flowsheet for further details and programmatic requirements.    The following portions of the patient's history were reviewed and updated as appropriate: allergies, current medications, past medical history, past social history, past surgical history and problem list.  Objective:  There were no vitals filed for this visit.  Physical Exam Constitutional:      Appearance: Normal appearance.  HENT:     Head: Normocephalic.     Mouth/Throat:     Mouth: Mucous membranes are moist.     Pharynx: Oropharynx is clear. No oropharyngeal exudate.  Pulmonary:     Effort: Pulmonary effort is normal.  Genitourinary:    Penis: Normal.      Testes: Normal.     Comments: No lice, nits, or pest, no lesions or odor discharge.  Denies pain or tenderness with paplation of testicles.  No lesions, ulcers or masses present.    Musculoskeletal:     Cervical back: Normal range of motion and neck supple.  Lymphadenopathy:     Cervical: No cervical adenopathy.  Skin:    General: Skin is warm and dry.     Findings: No bruising, erythema, lesion or rash.  Neurological:     Mental Status: He is alert and oriented to person, place, and time.  Psychiatric:        Mood and Affect: Mood normal.        Behavior: Behavior normal.      Assessment and Plan:  Phillip Barr is a 35 y.o. male presenting to the [facility] Department for STI screening  1. Screening examination for venereal disease  - Gonococcus culture - Gram stain -  HBV Antigen/[facility]ody [facility] - HIV/HCV [facility] - Syphilis Serology, [facility] - Gonococcus culture Patient does not have STI symptoms Patient accepted all screenings including  gram stain, oral, urethral GC and bloodwork for HIV/RPR.  Patient meets criteria for HepB screening? Yes. Ordered? Yes Patient meets criteria for HepC screening? Yes. Ordered? Yes Recommended condom use with all sex Discussed importance of condom use for STI prevent  Treat gram stain per standing order Discussed time line for [facility] results and that patient will be called with positive results and encouraged patient to call if he had not heard in 2 weeks Recommended returning for continued or worsening symptoms.      Return for as needed.  No future appointments.  Wendi Snipes, FNP

## 2020-07-19 LAB — HM HIV SCREENING LAB: HM HIV Screening: NEGATIVE

## 2020-07-19 LAB — HM HEPATITIS C SCREENING LAB: HM Hepatitis Screen: NEGATIVE

## 2020-07-21 LAB — GONOCOCCUS CULTURE

## 2021-02-01 ENCOUNTER — Emergency Department: Payer: Self-pay

## 2021-02-01 ENCOUNTER — Emergency Department
Admission: EM | Admit: 2021-02-01 | Discharge: 2021-02-02 | Disposition: A | Discharge status: Home / Self Care | Payer: Self-pay | Attending: Emergency Medicine | Admitting: Emergency Medicine

## 2021-02-01 ENCOUNTER — Other Ambulatory Visit: Payer: Self-pay

## 2021-02-01 ENCOUNTER — Encounter: Payer: Self-pay | Admitting: Emergency Medicine

## 2021-02-01 DIAGNOSIS — M25531 Pain in right wrist: Secondary | ICD-10-CM | POA: Insufficient documentation

## 2021-02-01 MED ORDER — MELOXICAM 15 MG PO TABS: 15.0000 mg | ORAL_TABLET | Freq: Every day | ORAL | 1 refills | Status: AC

## 2021-02-01 NOTE — ED Provider Notes (Signed)
[facility] Provider Note  Patient Contact: 11:27 PM (approximate)   History   Wrist Pain   HPI  Phillip Barr is a 36 y.o. male  presents to the ED with right wrist pain along the first dorsal extensor compartment. Patient engages in repetitive activities at work. No numbness or tingling in the right hand. No similar injuries in the past.       Physical Exam   Triage Vital Signs: ED Triage Vitals  Enc Vitals Group     BP 02/01/21 2218 120/71     Pulse Rate 02/01/21 2218 61     Resp 02/01/21 2218 18     Temp 02/01/21 2218 98 F (36.7 C)     Temp Source 02/01/21 2218 Oral     SpO2 02/01/21 2218 96 %     Weight 02/01/21 2216 122 lb (55.3 kg)     Height 02/01/21 2216 5\' 7"  (1.702 m)     Head Circumference --      Peak Flow --      Pain Score 02/01/21 2216 7     Pain Loc --      Pain Edu? --      Excl. in GC? --     Most recent vital signs: Vitals:   02/01/21 2218  BP: 120/71  Pulse: 61  Resp: 18  Temp: 98 F (36.7 C)  SpO2: 96%     General: Alert and in no acute distress. Eyes:  PERRL. EOMI. Head: No acute traumatic findings ENT:      Ears:       Nose: No congestion/rhinnorhea.      Mouth/Throat: Mucous membranes are moist. Neck: No stridor. No cervical spine tenderness to palpation. Cardiovascular:  Good peripheral perfusion Respiratory: Normal respiratory effort without tachypnea or retractions. Lungs CTAB. Good air entry to the bases with no decreased or absent breath sounds. Gastrointestinal: Bowel sounds 4 quadrants. Soft and nontender to palpation. No guarding or rigidity. No palpable masses. No distention. No CVA tenderness. Musculoskeletal: Full range of motion to all extremities. Positive Finkelstein test on the right. Palpable radial and ulnar pulses bilaterally and symmetrically. Neurologic:  No gross focal neurologic deficits are appreciated.  Skin:   No rash noted Other:   ED Results / Procedures / Treatments    Labs (all labs ordered are listed, but only abnormal results are displayed) Labs Reviewed - No data to display      RADIOLOGY  I personally viewed and evaluated these images as part of my medical decision making, as well as reviewing the written report by the radiologist.  ED Provider Interpretation: No acute bony abnormality at the right wrist.      MEDICATIONS ORDERED IN ED: Medications - No data to display   IMPRESSION / MDM / ASSESSMENT AND PLAN / ED COURSE  I reviewed the triage vital signs and the nursing notes.                              Differential diagnosis includes, but is not limited to, De Quervain's Tenosynovitis, Carpal Tunnel, wrist contusion...  Assessment and Plan: Wrist pain:  36 year old male presents with right wrist pain. No bony abnormality on xray. Patient was discharged with Meloxicam and a wrist brace was provided. Follow up referral to ortho given.       FINAL CLINICAL IMPRESSION(S) / ED DIAGNOSES   Final diagnoses:  Right wrist  pain     Rx / DC Orders   ED Discharge Orders          Ordered    meloxicam (MOBIC) 15 MG tablet  Daily        02/01/21 2324             Note:  This document was prepared using Dragon voice recognition software and may include unintentional dictation errors.   Pia Mau [place], PA-C 02/01/21 2333    Phineas Semen, MD 02/01/21 2351

## 2021-02-01 NOTE — Discharge Instructions (Addendum)
Take Meloxicam once daily.  Wear splint at night.

## 2021-02-01 NOTE — ED Triage Notes (Signed)
Pt arrived via POV with reports of R wrist pain x 3-4 days, denies injury, states there was swelling, but has improved.  Feels like something grinding.

## 2021-03-17 ENCOUNTER — Other Ambulatory Visit: Payer: Self-pay

## 2021-03-17 ENCOUNTER — Emergency Department
Admission: EM | Admit: 2021-03-17 | Discharge: 2021-03-17 | Disposition: A | Discharge status: Home / Self Care | Payer: No Typology Code available for payment source | Attending: Emergency Medicine | Admitting: Emergency Medicine

## 2021-03-17 ENCOUNTER — Encounter: Payer: Self-pay | Admitting: Emergency Medicine

## 2021-03-17 DIAGNOSIS — R1013 Epigastric pain: Secondary | ICD-10-CM | POA: Diagnosis present

## 2021-03-17 LAB — URINALYSIS, ROUTINE W REFLEX MICROSCOPIC
Bilirubin Urine: NEGATIVE
Glucose, UA: NEGATIVE mg/dL
Hgb urine dipstick: NEGATIVE
Ketones, ur: 5 mg/dL — AB
Leukocytes,Ua: NEGATIVE
Nitrite: NEGATIVE
Protein, ur: NEGATIVE mg/dL
Specific Gravity, Urine: 1.01 (ref 1.005–1.030)
pH: 5 (ref 5.0–8.0)

## 2021-03-17 LAB — CBC
HCT: 46.6 % (ref 39.0–52.0)
Hemoglobin: 15.2 g/dL (ref 13.0–17.0)
MCH: 31.5 pg (ref 26.0–34.0)
MCHC: 32.6 g/dL (ref 30.0–36.0)
MCV: 96.7 fL (ref 80.0–100.0)
Platelets: 254 10*3/uL (ref 150–400)
RBC: 4.82 MIL/uL (ref 4.22–5.81)
RDW: 13.4 % (ref 11.5–15.5)
WBC: 5.7 10*3/uL (ref 4.0–10.5)
nRBC: 0 % (ref 0.0–0.2)

## 2021-03-17 LAB — COMPREHENSIVE METABOLIC PANEL
ALT: 20 U/L (ref 0–44)
AST: 29 U/L (ref 15–41)
Albumin: 4.7 g/dL (ref 3.5–5.0)
Alkaline Phosphatase: 48 U/L (ref 38–126)
Anion gap: 9 (ref 5–15)
BUN: 15 mg/dL (ref 6–20)
CO2: 24 mmol/L (ref 22–32)
Calcium: 9.2 mg/dL (ref 8.9–10.3)
Chloride: 100 mmol/L (ref 98–111)
Creatinine, Ser: 1.03 mg/dL (ref 0.61–1.24)
GFR, Estimated: >60 mL/min (ref >60)
Glucose, Bld: 144 mg/dL — ABNORMAL HIGH (ref 70–99)
Potassium: 4.6 mmol/L (ref 3.5–5.1)
Sodium: 133 mmol/L — ABNORMAL LOW (ref 135–145)
Total Bilirubin: 0.8 mg/dL (ref 0.3–1.2)
Total Protein: 7.8 g/dL (ref 6.5–8.1)

## 2021-03-17 LAB — LIPASE, BLOOD: Lipase: 28 U/L (ref 11–51)

## 2021-03-17 MED ORDER — ALUM & MAG HYDROXIDE-SIMETH 200-200-20 MG/5ML PO SUSP
30.0000 mL | Freq: Once | ORAL | Status: AC
  Administered 2021-03-17: 30 mL via ORAL
  Filled 2021-03-17: qty 30

## 2021-03-17 MED ORDER — PANTOPRAZOLE SODIUM 40 MG PO TBEC: 40.0000 mg | DELAYED_RELEASE_TABLET | Freq: Every day | ORAL | 1 refills | Status: AC

## 2021-03-17 MED ORDER — LIDOCAINE VISCOUS HCL 2 % MT SOLN
15.0000 mL | Freq: Once | OROMUCOSAL | Status: AC
  Administered 2021-03-17: 15 mL via ORAL
  Filled 2021-03-17: qty 15

## 2021-03-17 NOTE — ED Provider Notes (Addendum)
[facility] Provider Note    Event Date/Time   First MD Initiated Contact with Patient 03/17/21 1058     (approximate)   History   Abdominal Pain   HPI  Phillip Barr is a 36 y.o. male who complains of epigastric pain occasionally radiating up into the chest and throat.  He is reports it feels like gastritis he had before.  There is no fever nausea vomiting or diarrhea.  Sometimes eating some foods makes it worse has been going on for about 3 days since he drank a lot of alcohol over the weekend.      Physical Exam   Triage Vital Signs: ED Triage Vitals [03/17/21 0935]  Enc Vitals Group     BP 128/75     Pulse Rate 74     Resp 18     Temp 97.6 F (36.4 C)     Temp Source Oral     SpO2 98 %     Weight 125 lb (56.7 kg)     Height 5\' 6"  (1.676 m)     Head Circumference      Peak Flow      Pain Score 6     Pain Loc      Pain Edu?      Excl. in GC?     Most recent vital signs: Vitals:   03/17/21 0935  BP: 128/75  Pulse: 74  Resp: 18  Temp: 97.6 F (36.4 C)  SpO2: 98%    General: Awake, no distress.  CV:  Good peripheral perfusion.  Heart regular rate and rhythm no audible murmurs Resp:  Normal effort.  Lungs are clear Abd:  No distention.  Abdomen soft nontender except for in the epigastric area just to the right of it.  Palpation reproduces his pain exactly.    ED Results / Procedures / Treatments   Labs (all labs ordered are listed, but only abnormal results are displayed) Labs Reviewed  COMPREHENSIVE METABOLIC PANEL - Abnormal; Notable for the following components:      Result Value   Sodium 133 (*)    Glucose, Bld 144 (*)    All other components within normal limits  URINALYSIS, ROUTINE W REFLEX MICROSCOPIC - Abnormal; Notable for the following components:   Color, Urine STRAW (*)    APPearance CLEAR (*)    Ketones, ur 5 (*)    All other components within normal limits  LIPASE, BLOOD  CBC      EKG     RADIOLOGY    PROCEDURES:  Critical Care performed:   Procedures   MEDICATIONS ORDERED IN ED: Medications  alum & mag hydroxide-simeth (MAALOX/MYLANTA) 200-200-20 MG/5ML suspension 30 mL (30 mLs Oral Given 03/17/21 1129)    And  lidocaine (XYLOCAINE) 2 % viscous mouth solution 15 mL (15 mLs Oral Given 03/17/21 1129)     IMPRESSION / MDM / ASSESSMENT AND PLAN / ED COURSE  I reviewed the triage vital signs and the nursing notes.  Patient has normal liver functions normal white count normal H&H and a normal lipase.  His symptoms remind him of gastritis and they improved markedly with GI cocktail.  This makes it most likely that he has gastritis.  His symptoms are not made worse with exertion.  I considered admission but there is nothing to admit this gentleman for.  I will give the gentleman GI cocktail equivalent that is Protonix 40 mg once a day.  If need be for the  next couple days he can take Maalox Mylanta or Tums.  Most likely diagnosis is gastritis.  He also could have an ulcer.  Heart disease or pancreatitis or cholecystitis or hepatitis I would very unlikely based on the presentation results of the lab work and GI cocktail and his past history.      FINAL CLINICAL IMPRESSION(S) / ED DIAGNOSES   Final diagnoses:  Epigastric pain     Rx / DC Orders   ED Discharge Orders          Ordered    pantoprazole (PROTONIX) 40 MG tablet  Daily        03/17/21 1157             Note:  This document was prepared using Dragon voice recognition software and may include unintentional dictation errors.   Arnaldo Natal, MD 03/17/21 1212    Arnaldo Natal, MD 03/17/21 1212

## 2021-03-17 NOTE — ED Triage Notes (Signed)
Pt here with abd pain for a few days. Pt states he has a hx of gastritis and believes it is a flare up. Pt states pain is in his upper abd. Pt denies N/V/D. Pt in NAD in triage.

## 2021-03-17 NOTE — Discharge Instructions (Addendum)
Take the Protonix 1 pill a day.  This should help with the gastritis symptoms and you should feel better after 3 to 4 days.  In the meantime you can take Maalox or Mylanta or Tums up to every 2 hours if it helps.  Avoid foods that irritate your stomach; usually these are citrusy acidy or spicy foods.  Milk may help.  Please return if you are not improving after 3 or 4 days or if you get worse or cannot keep any thing down.

## 2021-05-16 ENCOUNTER — Emergency Department
Admission: EM | Admit: 2021-05-16 | Discharge: 2021-05-16 | Disposition: A | Discharge status: Home / Self Care | Payer: No Typology Code available for payment source | Attending: Emergency Medicine | Admitting: Emergency Medicine

## 2021-05-16 DIAGNOSIS — H109 Unspecified conjunctivitis: Secondary | ICD-10-CM | POA: Insufficient documentation

## 2021-05-16 MED ORDER — POLYMYXIN B-TRIMETHOPRIM 10000-0.1 UNIT/ML-% OP SOLN: 2.0000 [drp] | Freq: Four times a day (QID) | OPHTHALMIC | 0 refills | Status: DC

## 2021-05-16 MED ORDER — POLYMYXIN B-TRIMETHOPRIM 10000-0.1 UNIT/ML-% OP SOLN
2.0000 [drp] | Freq: Four times a day (QID) | OPHTHALMIC | Status: DC
  Administered 2021-05-16: 2 [drp] via OPHTHALMIC
  Filled 2021-05-16: qty 10

## 2021-05-16 NOTE — ED Provider Notes (Signed)
? ?[facility] ?Provider Note ? ?Patient Contact: 8:01 PM (approximate) ? ? ?History  ? ?Conjunctivitis ? ? ?HPI ? ?Phillip Barr is a 36 y.o. male who presents to the emergency department with his son who has similar symptoms.  Patient is complaining of right eye pain, erythema and drainage.  Son is complaining both eyes being involved.  No other symptoms or complaints.  No medications prior to arrival.  No vision changes. ?  ? ? ?Physical Exam  ? ?Triage Vital Signs: ?ED Triage Vitals [05/16/21 1914]  ?Enc Vitals Group  ?   BP 113/66  ?   Pulse Rate 64  ?   Resp 14  ?   Temp 98.6 ?F (37 ?C)  ?   Temp Source Oral  ?   SpO2 96 %  ?   Weight   ?   Height   ?   Head Circumference   ?   Peak Flow   ?   Pain Score   ?   Pain Loc   ?   Pain Edu?   ?   Excl. in GC?   ? ? ?Most recent vital signs: ?Vitals:  ? 05/16/21 1914  ?BP: 113/66  ?Pulse: 64  ?Resp: 14  ?Temp: 98.6 ?F (37 ?C)  ?SpO2: 96%  ? ? ? ?General: Alert and in no acute distress. ?Eyes:  PERRL. EOMI. right eye conjunctival erythema.  Purulent discharge noted on the eyelashes.  Left eye is unremarkable.  Funduscopic exam is reassuring.  ?Cardiovascular:  Good peripheral perfusion ?Respiratory: Normal respiratory effort without tachypnea or retractions. Lungs CTAB. ?Musculoskeletal: Full range of motion to all extremities.  ?Neurologic:  No gross focal neurologic deficits are appreciated.  ?Skin:   No rash noted ?Other: ? ? ?ED Results / Procedures / Treatments  ? ?Labs ?(all labs ordered are listed, but only abnormal results are displayed) ?Labs Reviewed - No data to display ? ? ?EKG ? ? ? ? ?RADIOLOGY ? ? ? ?No results found. ? ?PROCEDURES: ? ?Critical Care performed: No ? ?Procedures ? ? ?MEDICATIONS ORDERED IN ED: ?Medications  ?trimethoprim-polymyxin b (POLYTRIM) ophthalmic solution 2 drop (has no administration in time range)  ? ? ? ?IMPRESSION / MDM / ASSESSMENT AND PLAN / ED COURSE  ?I reviewed the triage vital signs and the  nursing notes. ?             ?               ? ?Differential diagnosis includes, but is not limited to, conjunctivitis, (viral, allergic, bacterial, chemical) ? ? ?Patient's diagnosis is consistent with bacterial conjunctivitis.  Patient presents with right eye irritation and drainage.  Patient's son is also being seen with similar symptoms.  Findings on exam are consistent with bacterial conjunctivitis.  Antibiotic Polytrim was started in the ED.  Follow-up with primary care or ophthalmology as needed..  Patient is given ED precautions to return to the ED for any worsening or new symptoms. ? ? ? ?  ? ? ?FINAL CLINICAL IMPRESSION(S) / ED DIAGNOSES  ? ?Final diagnoses:  ?Bacterial conjunctivitis of right eye  ? ? ? ?Rx / DC Orders  ? ?ED Discharge Orders   ? ?      Ordered  ?  trimethoprim-polymyxin b (POLYTRIM) ophthalmic solution  Every 6 hours       ? 05/16/21 2003  ? ?  ?  ? ?  ? ? ? ?Note:  This document was  prepared using Conservation officer, historic buildings and may include unintentional dictation errors. ?  ?Racheal Patches, PA-C ?05/16/21 2005 ? ?  ?Minna Antis, MD ?05/16/21 2315 ? ?

## 2021-05-16 NOTE — ED Triage Notes (Signed)
Pt presents c/o eye drainage and redness x3 days.  ?

## 2021-07-18 ENCOUNTER — Emergency Department: Payer: Self-pay

## 2021-07-18 ENCOUNTER — Other Ambulatory Visit: Payer: Self-pay

## 2021-07-18 ENCOUNTER — Encounter: Payer: Self-pay | Admitting: Emergency Medicine

## 2021-07-18 ENCOUNTER — Emergency Department
Admission: EM | Admit: 2021-07-18 | Discharge: 2021-07-18 | Disposition: A | Discharge status: Home / Self Care | Payer: Self-pay | Attending: Emergency Medicine | Admitting: Emergency Medicine

## 2021-07-18 DIAGNOSIS — M461 Sacroiliitis, not elsewhere classified: Secondary | ICD-10-CM | POA: Insufficient documentation

## 2021-07-18 DIAGNOSIS — M545 Low back pain, unspecified: Secondary | ICD-10-CM

## 2021-07-18 MED ORDER — CYCLOBENZAPRINE HCL 10 MG PO TABS: 10.0000 mg | ORAL_TABLET | Freq: Three times a day (TID) | ORAL | 0 refills | Status: DC | PRN

## 2021-07-18 MED ORDER — LIDOCAINE 5 % EX PTCH: 1.0000 | MEDICATED_PATCH | Freq: Two times a day (BID) | CUTANEOUS | 0 refills | Status: AC

## 2021-08-08 ENCOUNTER — Ambulatory Visit: Payer: Self-pay | Admitting: Internal Medicine

## 2021-10-06 ENCOUNTER — Emergency Department
Admission: EM | Admit: 2021-10-06 | Discharge: 2021-10-06 | Disposition: A | Discharge status: Home / Self Care | Payer: Self-pay | Attending: Emergency Medicine | Admitting: Emergency Medicine

## 2021-10-06 ENCOUNTER — Encounter: Payer: Self-pay | Admitting: Intensive Care

## 2021-10-06 ENCOUNTER — Other Ambulatory Visit: Payer: Self-pay

## 2021-10-06 DIAGNOSIS — Z20822 Contact with and (suspected) exposure to covid-19: Secondary | ICD-10-CM | POA: Insufficient documentation

## 2021-10-06 DIAGNOSIS — B349 Viral infection, unspecified: Secondary | ICD-10-CM | POA: Insufficient documentation

## 2021-10-06 LAB — RESP PANEL BY RT-PCR (FLU A&B, COVID) ARPGX2
Influenza A by PCR: NEGATIVE
Influenza B by PCR: NEGATIVE
SARS Coronavirus 2 by RT PCR: NEGATIVE

## 2021-10-06 LAB — GROUP A STREP BY PCR: Group A Strep by PCR: NOT DETECTED

## 2021-10-06 MED ORDER — ONDANSETRON 4 MG PO TBDP
4.0000 mg | ORAL_TABLET | Freq: Once | ORAL | Status: AC
  Administered 2021-10-06: 4 mg via ORAL
  Filled 2021-10-06: qty 1

## 2021-10-06 MED ORDER — ACETAMINOPHEN 500 MG PO TABS
1000.0000 mg | ORAL_TABLET | Freq: Once | ORAL | Status: AC
  Administered 2021-10-06: 1000 mg via ORAL
  Filled 2021-10-06: qty 2

## 2021-10-06 NOTE — Discharge Instructions (Addendum)
-  You may take Tylenol/ibuprofen as needed for headache/sore throat.  -Return to the emergency department anytime if you begin to experience any new or worsening symptoms.

## 2021-10-06 NOTE — ED Notes (Signed)
See triage note  Presents with some h/a and nausea this am   Denies any trauma

## 2021-10-06 NOTE — ED Provider Notes (Signed)
[facility] Provider Note    Event Date/Time   First MD Initiated Contact with Patient 10/06/21 [phone]     (approximate)   History   Chief Complaint Headache and Nausea   HPI Phillip Barr is a 36 y.o. male, no significant medical history, presents emergency department for evaluation of headache/nausea.  Patient states that the symptoms began this morning, in addition endorses some sinus congestion.  He states that his coworkers have also been coming down with some sickness as well with similar symptoms.  However, nobody has tested positive for anything in particular.  Denies fever, chest pain, shortness of breath, abdominal pain, flank pain,diarrhea, dysuria, rash/lesions, or dizziness/lightheadedness.  History Limitations: No limitations.        Physical Exam  Triage Vital Signs: ED Triage Vitals  Enc Vitals Group     BP 10/06/21 0920 117/75     Pulse Rate 10/06/21 0920 62     Resp 10/06/21 0920 14     Temp 10/06/21 0920 98.5 F (36.9 C)     Temp Source 10/06/21 0920 Oral     SpO2 10/06/21 0920 99 %     Weight 10/06/21 0916 120 lb (54.4 kg)     Height 10/06/21 0916 5\' 7"  (1.702 m)     Head Circumference --      Peak Flow --      Pain Score 10/06/21 0916 4     Pain Loc --      Pain Edu? --      Excl. in [place]? --     Most recent vital signs: Vitals:   10/06/21 0920  BP: 117/75  Pulse: 62  Resp: 14  Temp: 98.5 F (36.9 C)  SpO2: 99%    General: Awake, NAD.  Skin: Warm, dry. No rashes or lesions.  Eyes: PERRL. Conjunctivae normal.  CV: Good peripheral perfusion.  Resp: Normal effort.  Lung sounds are clear bilaterally in the apices and bases. Abd: Soft, non-tender. No distention.  Neuro: At baseline. No gross neurological deficits.  Musculoskeletal: Normal ROM of all extremities.   Focused Exam: Throat exam shows some mild erythema, otherwise no tonsillar swelling or exudates.  Uvula midline.  Physical Exam    ED Results /  Procedures / Treatments  Labs (all labs ordered are listed, but only abnormal results are displayed) Labs Reviewed  RESP PANEL BY RT-PCR (FLU A&B, COVID) ARPGX2  GROUP A STREP BY PCR     EKG N/A.   RADIOLOGY  ED Provider Interpretation: N/A.  No results found.  PROCEDURES:  Critical Care performed: N/A.  Procedures    MEDICATIONS ORDERED IN ED: Medications  ondansetron (ZOFRAN-ODT) disintegrating tablet 4 mg (4 mg Oral Given 10/06/21 0938)  acetaminophen (TYLENOL) tablet 1,000 mg (1,000 mg Oral Given 10/06/21 10/08/21)     IMPRESSION / MDM / ASSESSMENT AND PLAN / ED COURSE  I reviewed the triage vital signs and the nursing notes.                              Differential diagnosis includes, but is not limited to, COVID-19, influenza, viral URI, bronchitis, pneumonia.  Assessment/Plan Presentation consistent with viral URI.  Respiratory panel negative for COVID-19 or influenza.  Group A strep PCR negative.  He appears well clinically.  Further testing indicated at this time.  Encouraged him to continue take Tylenol/ibuprofen and over-the-counter medications as needed.  Will discharge.  Provided the  patient with anticipatory guidance, return precautions, and educational material. Encouraged the patient to return to the emergency department at any time if they begin to experience any new or worsening symptoms. Patient expressed understanding and agreed with the plan.   Patient's presentation is most consistent with acute complicated illness / injury requiring diagnostic workup.       FINAL CLINICAL IMPRESSION(S) / ED DIAGNOSES   Final diagnoses:  Viral syndrome     Rx / DC Orders   ED Discharge Orders     None        Note:  This document was prepared using Dragon voice recognition software and may include unintentional dictation errors.   Varney Daily, [place] 10/06/21 1559    Shaune Pollack, MD 10/11/21 [phone]

## 2021-10-06 NOTE — ED Triage Notes (Signed)
Reports awakening this AM with headache and nausea. NAD at this time

## 2021-11-08 ENCOUNTER — Emergency Department
Admission: EM | Admit: 2021-11-08 | Discharge: 2021-11-08 | Disposition: A | Discharge status: Home / Self Care | Payer: Self-pay | Attending: Emergency Medicine | Admitting: Emergency Medicine

## 2021-11-08 ENCOUNTER — Other Ambulatory Visit: Payer: Self-pay

## 2021-11-08 ENCOUNTER — Emergency Department: Payer: Self-pay

## 2021-11-08 DIAGNOSIS — M79604 Pain in right leg: Secondary | ICD-10-CM | POA: Insufficient documentation

## 2021-11-08 NOTE — ED Triage Notes (Signed)
Pt in with co intermittent pain to right anterior thigh for 2 months. STates no injury.

## 2021-11-08 NOTE — Discharge Instructions (Signed)
Xray negative but follow up with ortho  return for fevers worsenign pain or other concerns

## 2021-11-08 NOTE — ED Provider Notes (Signed)
   [facility] Provider Note    Event Date/Time   First MD Initiated Contact with Patient 11/08/21 1136     (approximate)   History   Leg Pain   HPI  Phillip Barr is a 36 y.o. male who comes in with leg pain. Reports coming 2-3 times a week and then goes away. It has been going on for 4 months. Does a lot of heaving lifting. His work wanted clearance to go back to work. Otherwise healthy. No swelling in leg, No recent surg or recent long travel- no hormone use.   Physical Exam   Triage Vital Signs: ED Triage Vitals  Enc Vitals Group     BP 11/08/21 0946 (!) 142/92     Pulse Rate 11/08/21 0946 72     Resp 11/08/21 0946 18     Temp 11/08/21 0946 98.2 F (36.8 C)     Temp Source 11/08/21 0946 Oral     SpO2 11/08/21 0946 100 %     Weight 11/08/21 0947 121 lb (54.9 kg)     Height 11/08/21 0947 5\' 7"  (1.702 m)     Head Circumference --      Peak Flow --      Pain Score 11/08/21 0947 0     Pain Loc --      Pain Edu? --      Excl. in [place]? --     Most recent vital signs: Vitals:   11/08/21 0946  BP: (!) 142/92  Pulse: 72  Resp: 18  Temp: 98.2 F (36.8 C)  SpO2: 100%     General: Awake, no distress.  CV:  Good peripheral perfusion.  Resp:  Normal effort.  Abd:  No distention.  Other:  No pain now but reports pain on the anterior part of lower thigh. No knee pain. Able to flex and extend ankle. 2+ distal pulse.  No calf swelling.  No calf tenderness.  Denies any pain at this time.  Patient ambulatory.  No redness or warmth noted.   ED Results / Procedures / Treatments    RADIOLOGY I have reviewed the xray personally and interpretted  and no fracture   PROCEDURES:  Critical Care performed: No  Procedures   MEDICATIONS ORDERED IN ED: Medications - No data to display   IMPRESSION / MDM / [place] / ED COURSE  I reviewed the triage vital signs and the nursing notes.   Patient's presentation is most consistent with  acute, uncomplicated illness.   Suspect probable muscle strain versus tendon strain.  X-ray ordered without evidence of any mass.  No evidence of DVT on examination good distal pulse.  We discussed follow-up with orthopedics but nothing that looks emergent today    FINAL CLINICAL IMPRESSION(S) / ED DIAGNOSES   Final diagnoses:  Right leg pain     Rx / DC Orders   ED Discharge Orders     None        Note:  This document was prepared using Dragon voice recognition software and may include unintentional dictation errors.   Vanessa Tok, MD 11/08/21 1144

## 2022-03-27 ENCOUNTER — Ambulatory Visit: Payer: Self-pay

## 2022-12-03 ENCOUNTER — Other Ambulatory Visit: Payer: Self-pay

## 2022-12-03 ENCOUNTER — Emergency Department
Admission: EM | Admit: 2022-12-03 | Discharge: 2022-12-03 | Disposition: A | Discharge status: Home / Self Care | Payer: Self-pay | Attending: Emergency Medicine | Admitting: Emergency Medicine

## 2022-12-03 DIAGNOSIS — R109 Unspecified abdominal pain: Secondary | ICD-10-CM | POA: Insufficient documentation

## 2022-12-03 DIAGNOSIS — J069 Acute upper respiratory infection, unspecified: Secondary | ICD-10-CM | POA: Insufficient documentation

## 2022-12-03 DIAGNOSIS — Z20822 Contact with and (suspected) exposure to covid-19: Secondary | ICD-10-CM | POA: Insufficient documentation

## 2022-12-03 LAB — COMPREHENSIVE METABOLIC PANEL
ALT: 17 U/L (ref 0–44)
AST: 25 U/L (ref 15–41)
Albumin: 4.3 g/dL (ref 3.5–5.0)
Alkaline Phosphatase: 62 U/L (ref 38–126)
Anion gap: 9 (ref 5–15)
BUN: 14 mg/dL (ref 6–20)
CO2: 26 mmol/L (ref 22–32)
Calcium: 9.3 mg/dL (ref 8.9–10.3)
Chloride: 103 mmol/L (ref 98–111)
Creatinine, Ser: 0.78 mg/dL (ref 0.61–1.24)
GFR, Estimated: >60 mL/min (ref >60)
Glucose, Bld: 107 mg/dL — ABNORMAL HIGH (ref 70–99)
Potassium: 4.6 mmol/L (ref 3.5–5.1)
Sodium: 138 mmol/L (ref 135–145)
Total Bilirubin: 0.7 mg/dL (ref <1.2)
Total Protein: 7.5 g/dL (ref 6.5–8.1)

## 2022-12-03 LAB — RESP PANEL BY RT-PCR (RSV, FLU A&B, COVID)  RVPGX2
Influenza A by PCR: NEGATIVE
Influenza B by PCR: NEGATIVE
Resp Syncytial Virus by PCR: NEGATIVE
SARS Coronavirus 2 by RT PCR: NEGATIVE

## 2022-12-03 LAB — CBC
HCT: 44.8 % (ref 39.0–52.0)
Hemoglobin: 15 g/dL (ref 13.0–17.0)
MCH: 33.2 pg (ref 26.0–34.0)
MCHC: 33.5 g/dL (ref 30.0–36.0)
MCV: 99.1 fL (ref 80.0–100.0)
Platelets: 258 10*3/uL (ref 150–400)
RBC: 4.52 MIL/uL (ref 4.22–5.81)
RDW: 13.2 % (ref 11.5–15.5)
WBC: 10.3 10*3/uL (ref 4.0–10.5)
nRBC: 0 % (ref 0.0–0.2)

## 2022-12-03 LAB — LIPASE, BLOOD: Lipase: 38 U/L (ref 11–51)

## 2022-12-03 LAB — URINALYSIS, ROUTINE W REFLEX MICROSCOPIC
Bilirubin Urine: NEGATIVE
Glucose, UA: NEGATIVE mg/dL
Hgb urine dipstick: NEGATIVE
Ketones, ur: NEGATIVE mg/dL
Leukocytes,Ua: NEGATIVE
Nitrite: NEGATIVE
Protein, ur: NEGATIVE mg/dL
Specific Gravity, Urine: 1.019 (ref 1.005–1.030)
pH: 6 (ref 5.0–8.0)

## 2022-12-03 MED ORDER — OMEPRAZOLE 40 MG PO CPDR: 40.0000 mg | DELAYED_RELEASE_CAPSULE | Freq: Every day | ORAL | 2 refills | Status: AC

## 2022-12-03 NOTE — ED Notes (Signed)
Pt verbalizes understanding of discharge instructions. Opportunity for questioning and answers were provided. Pt discharged from ED to home with significant other.   ? ?

## 2022-12-03 NOTE — ED Triage Notes (Signed)
Pt comes with c/o cough and congestion for couple days. Pt states he also has hx of gastroenteritis and was prescribed meds but hasn't been on them for awhile. Pt states not sure if it is flaring back up

## 2022-12-03 NOTE — ED Provider Notes (Signed)
[facility] Provider Note    Event Date/Time   First MD Initiated Contact with Patient 12/03/22 1128     (approximate)   History   Abdominal Pain and Cough   HPI Phillip Barr is a 37 y.o. male presenting today for cough and congestion.  Patient states over the past 2 days he has started having some nasal congestion and cough.  Wanted to get tested for COVID.  Denies fever, chills, body aches, chest pain, shortness of breath, nausea, vomiting.  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  Asking for that to be represcribed.     Physical Exam   Triage Vital Signs: ED Triage Vitals [12/03/22 1001]  Encounter Vitals Group     BP (!) 125/94     Systolic BP Percentile      Diastolic BP Percentile      Pulse Rate 62     Resp 18     Temp 97.9 F (36.6 C)     Temp src      SpO2 100 %     Weight 120 lb (54.4 kg)     Height 5\' 7"  (1.702 m)     Head Circumference      Peak Flow      Pain Score 8     Pain Loc      Pain Education      Exclude from Growth Chart     Most recent vital signs: Vitals:   12/03/22 1001  BP: (!) 125/94  Pulse: 62  Resp: 18  Temp: 97.9 F (36.6 C)  SpO2: 100%   I have reviewed the vital signs. General:  Awake, alert, no acute distress. Head:  Normocephalic, Atraumatic. EENT:  PERRL, EOMI, Oral mucosa pink and moist, Neck is supple. Cardiovascular: Regular rate, 2+ distal pulses. Respiratory:  Normal respiratory effort, symmetrical expansion, no distress.   Extremities:  Moving all four extremities through full ROM without pain.   Neuro:  Alert and oriented.  Interacting appropriately.   Skin:  Warm, dry, no rash.   Psych: Appropriate affect.    ED Results / Procedures / Treatments   Labs (all labs ordered are listed, but only abnormal results are displayed) Labs Reviewed  COMPREHENSIVE METABOLIC PANEL - Abnormal; Notable for the following components:       Result Value   Glucose, Bld 107 (*)    All other components within normal limits  URINALYSIS, ROUTINE W REFLEX MICROSCOPIC - Abnormal; Notable for the following components:   Color, Urine YELLOW (*)    APPearance CLEAR (*)    All other components within normal limits  RESP PANEL BY RT-PCR (RSV, FLU A&B, COVID)  RVPGX2  LIPASE, BLOOD  CBC     EKG    RADIOLOGY    PROCEDURES:  Critical Care performed: No  Procedures   MEDICATIONS ORDERED IN ED: Medications - No data to display   IMPRESSION / MDM / ASSESSMENT AND PLAN / ED COURSE  I reviewed the triage vital signs and the nursing notes.                              Differential diagnosis includes, but is not limited to, viral URI with cough, low suspicion for pneumonia  Patient's presentation is most consistent with acute complicated illness / injury requiring diagnostic workup.  Patient is a 37 year old male presenting today  for cough and congestion x 2 days.  Vital signs are stable and physical exam unremarkable.  Viral swabs are negative.  Laboratory workup ordered in triage also unremarkable.  Still suspect most likely viral upper respiratory infection and recommended symptomatic treatment at home.  Separately, asking for represcription of his prior medications that with alcoholic gastritis.  Patient was represcribed omeprazole and given ambulatory referral for PCP.      FINAL CLINICAL IMPRESSION(S) / ED DIAGNOSES   Final diagnoses:  Viral URI with cough     Rx / DC Orders   ED Discharge Orders          Ordered    omeprazole (PRILOSEC) 40 MG capsule  Daily        12/03/22 1150    Ambulatory Referral to Primary Care (Establish Care)        12/03/22 1150             Note:  This document was prepared using Dragon voice recognition software and may include unintentional dictation errors.   Janith Lima, MD 12/03/22 [phone]

## 2022-12-29 ENCOUNTER — Encounter: Payer: Self-pay | Admitting: Nurse Practitioner

## 2022-12-29 ENCOUNTER — Ambulatory Visit: Payer: Self-pay | Admitting: Nurse Practitioner

## 2022-12-29 DIAGNOSIS — Z113 Encounter for screening for infections with a predominantly sexual mode of transmission: Secondary | ICD-10-CM

## 2022-12-29 LAB — HEPATITIS B SURFACE ANTIGEN

## 2022-12-29 LAB — HM HEPATITIS C SCREENING LAB: HM Hepatitis Screen: NEGATIVE

## 2022-12-29 LAB — HM HIV SCREENING LAB: HM HIV Screening: NEGATIVE

## 2022-12-29 NOTE — Progress Notes (Signed)
[facility] Department STI clinic/screening visit  Subjective:  THALES TREADWELL is a 37 y.o. male being seen today for an STI screening visit. The patient reports they do not have symptoms.    Patient has the following medical conditions:  There are no problems to display for this patient.  Chief Complaint  Patient presents with   SEXUALLY TRANSMITTED DISEASE    Pt is here for STD screening and has no symptoms    Patient is a pleasant 37 y.o. male who presents to clinic today for asymptomatic STI screening. He reports an STI history but is unsure what or when it was. Review of chart is unrevealing. Patient endorses last sexual intercourse was 1 day ago. He reports oral and vaginal penetrative sex with  2 male partners in the last 2 months and condom use sometimes.    Last HIV test per patient/review of record was  Lab Results  Component Value Date   HMHIVSCREEN Negative - Validated 07/19/2020   No results found for: "HIV"  Last HEPC test per patient/review of record was  Lab Results  Component Value Date   HMHEPCSCREEN Negative-Validated 07/19/2020   No components found for: "HEPC"   Last HEPB test per patient/review of record was No components found for: "HMHEPBSCREEN" No components found for: "HEPC"   Does the patient or their partner desires a pregnancy in the next year? No  Screening for MPX risk: Does the patient have an unexplained rash? No Is the patient MSM? No Does the patient endorse multiple sex partners or anonymous sex partners? Yes Did the patient have close or sexual contact with a person diagnosed with MPX? Yes Has the patient traveled outside the [place] where MPX is endemic? No Is there a high clinical suspicion for MPX-- evidenced by one of the following No  -Unlikely to be chickenpox  -Lymphadenopathy  -Rash that present in same phase of evolution on any given body part   See flowsheet for further details and programmatic requirements.     There is no immunization history on file for this patient.   The following portions of the patient's history were reviewed and updated as appropriate: allergies, current medications, past medical history, past social history, past surgical history and problem list.  Objective:  There were no vitals filed for this visit.  Physical Exam Nursing note reviewed.  Constitutional:      Appearance: Normal appearance.  HENT:     Head: Normocephalic and atraumatic.     Salivary Glands: Right salivary gland is not diffusely enlarged or tender. Left salivary gland is not diffusely enlarged or tender.     Mouth/Throat:     Lips: Pink. No lesions.     Mouth: Mucous membranes are moist.     Tongue: No lesions. Tongue does not deviate from midline.     Pharynx: Uvula midline. No oropharyngeal exudate or posterior oropharyngeal erythema.     Tonsils: No tonsillar exudate.  Eyes:     General:        Right eye: No discharge.        Left eye: No discharge.     Conjunctiva/sclera:     Right eye: Right conjunctiva is not injected. No exudate.    Left eye: Left conjunctiva is not injected. No exudate. Pulmonary:     Effort: Pulmonary effort is normal.  Genitourinary:    Comments: Declined genital exam- asymptomatic Lymphadenopathy:     Head:     Right side of head: No  submental, submandibular, tonsillar, preauricular or posterior auricular adenopathy.     Left side of head: No submental, submandibular, tonsillar, preauricular or posterior auricular adenopathy.     Cervical: No cervical adenopathy.     Right cervical: No superficial or posterior cervical adenopathy.    Left cervical: No superficial or posterior cervical adenopathy.     Upper Body:     Right upper body: No supraclavicular or axillary adenopathy.     Left upper body: No supraclavicular or axillary adenopathy.  Skin:    General: Skin is warm and dry.     Comments: Skin tone appropriate for ethnicity.   Neurological:      Mental Status: He is alert and oriented to person, place, and time.  Psychiatric:        Attention and Perception: Attention normal.        Mood and Affect: Mood and affect normal.        Speech: Speech normal.        Behavior: Behavior normal. Behavior is cooperative.        Thought Content: Thought content normal.    Assessment and Plan:  KIX GREENHAGEN is a 37 y.o. male presenting to the [facility] Department for STI screening  1. Screening for venereal disease  - HBV Antigen/Antibody State Lab - HIV/HCV [place] [facility] - Syphilis Serology, [place] [facility] - Gonococcus culture - Chlamydia/GC NAA, Confirmation   Patient does not have STI symptoms Patient accepted all screenings including  urine GC/Chlamydia, and blood work for HIV/Syphilis. Patient meets criteria for HepB screening? Yes. Ordered? yes Patient meets criteria for HepC screening? Yes. Ordered? yes Recommended condom use with all sex Discussed importance of condom use for STI prevent  Treat positive test results per standing order. Discussed time line for State Lab results and that patient will be called with positive results and encouraged patient to call if he had not heard in 2 weeks Recommended repeat testing in 3 months with positive results. Recommended returning for continued or worsening symptoms.   Return if symptoms worsen or fail to improve.  No future appointments.  Total time with patient 30 minutes.   Edmonia James, NP

## 2022-12-29 NOTE — Progress Notes (Signed)
Pt is here for STD screening.  Condoms declined.  Lafayette Dunlevy M Nesha Counihan, RN   

## 2023-01-03 LAB — CHLAMYDIA/GC NAA, CONFIRMATION
Chlamydia trachomatis, NAA: NEGATIVE
Neisseria gonorrhoeae, NAA: NEGATIVE

## 2023-01-03 LAB — GONOCOCCUS CULTURE

## 2023-02-10 IMAGING — CR DG WRIST COMPLETE 3+V*R*
1 series · 4 of 4 positions shown · non-contrast
Comparison: None.

CLINICAL DATA: Pain with decreased range of motion

EXAM:
RIGHT WRIST - COMPLETE 3+ VIEW

[Series 1: x wrist pa right · 0.14mm/px · 4 of 4 slices shown]
[im 1/4]
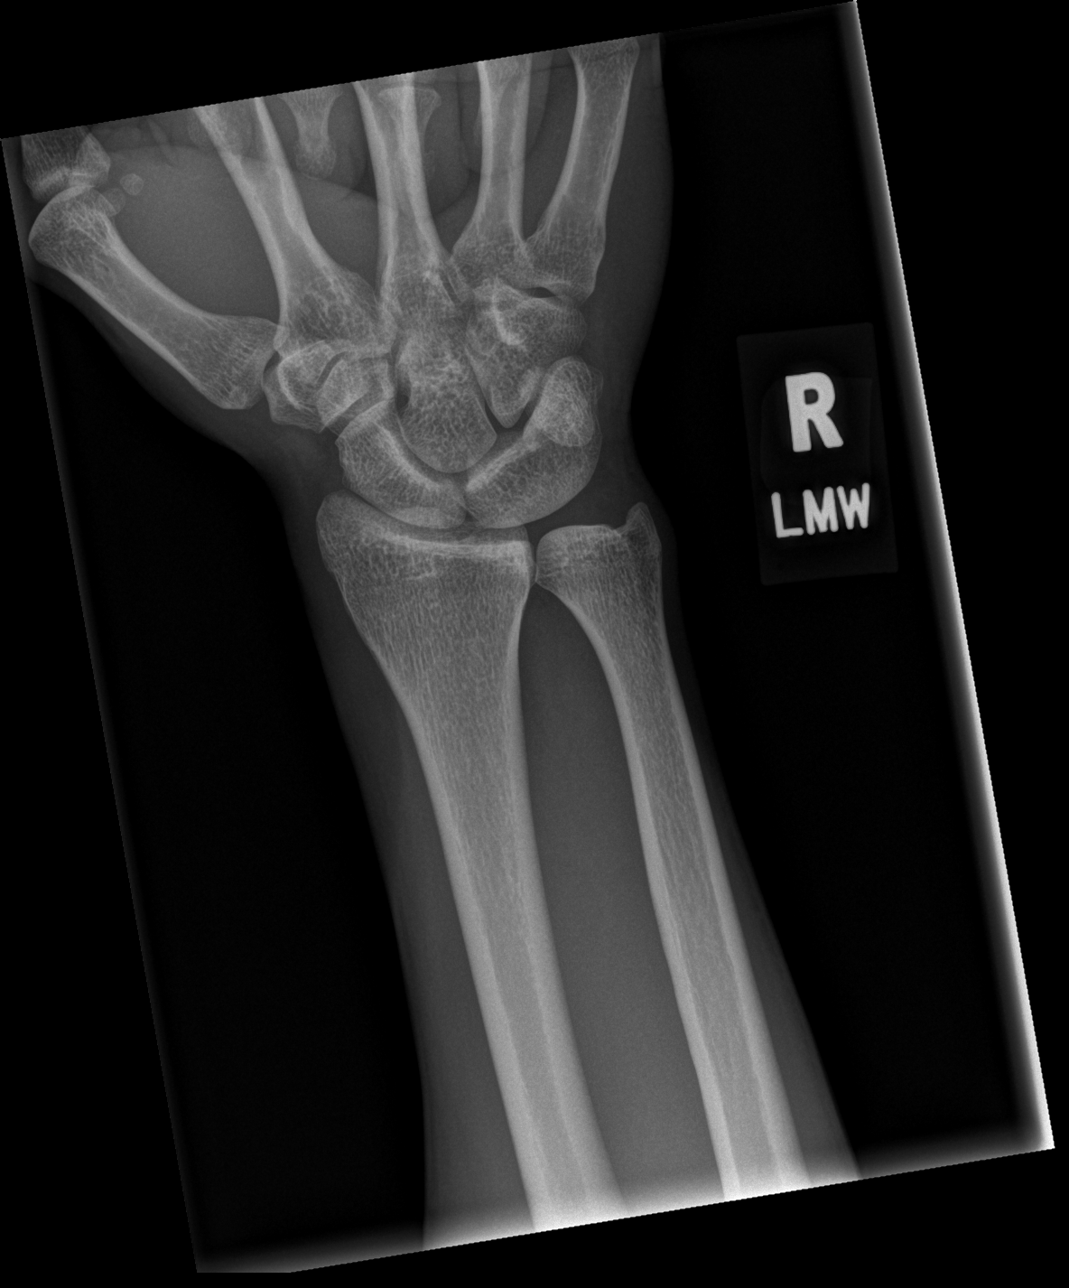
[im 2/4]
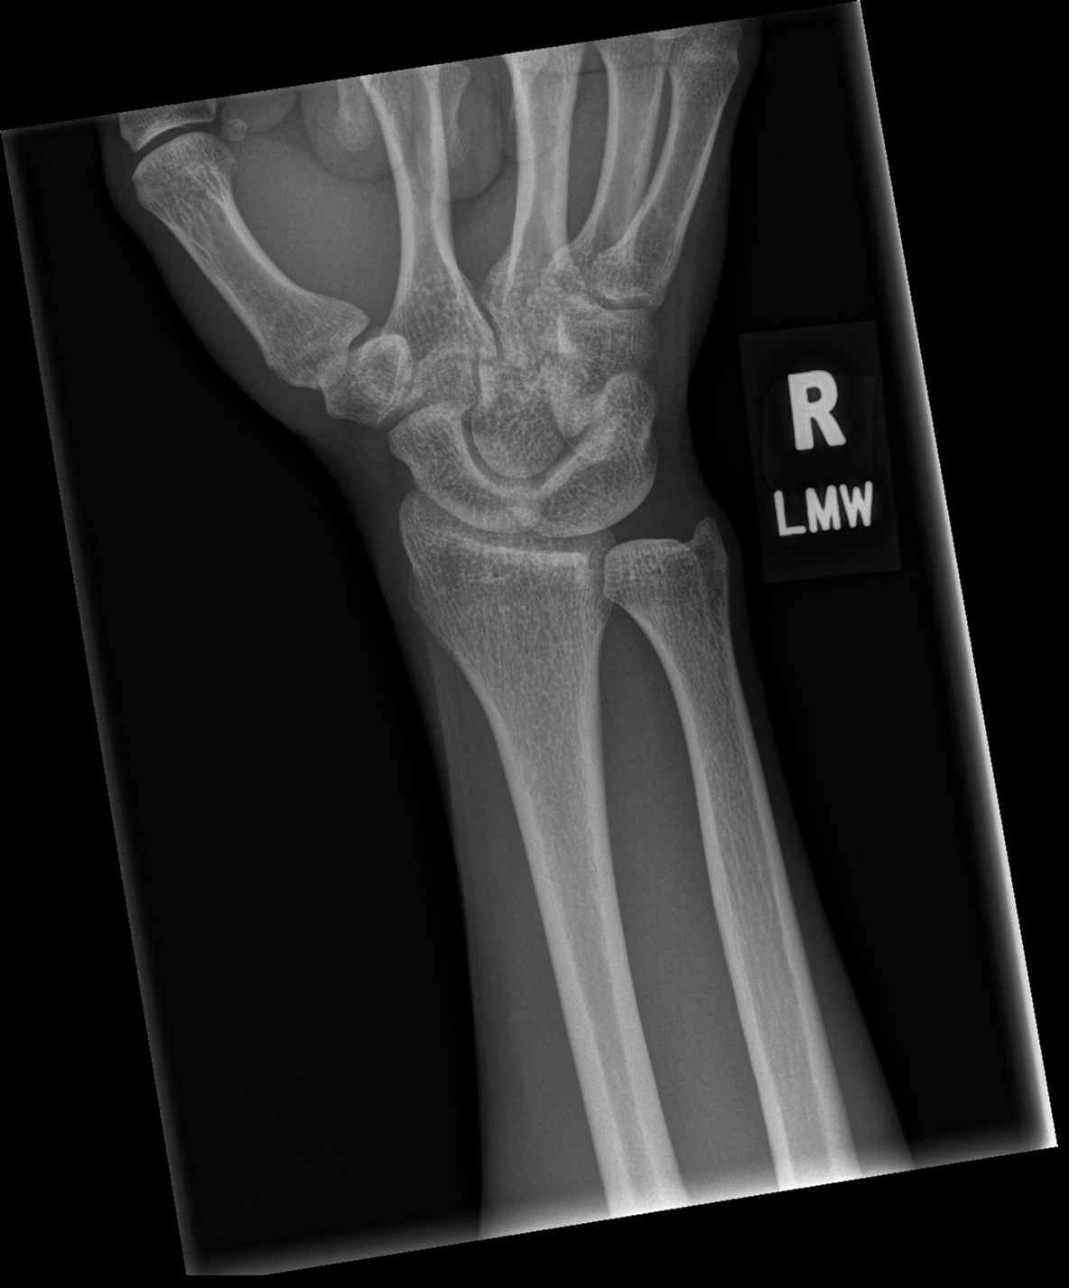
[im 3/4]
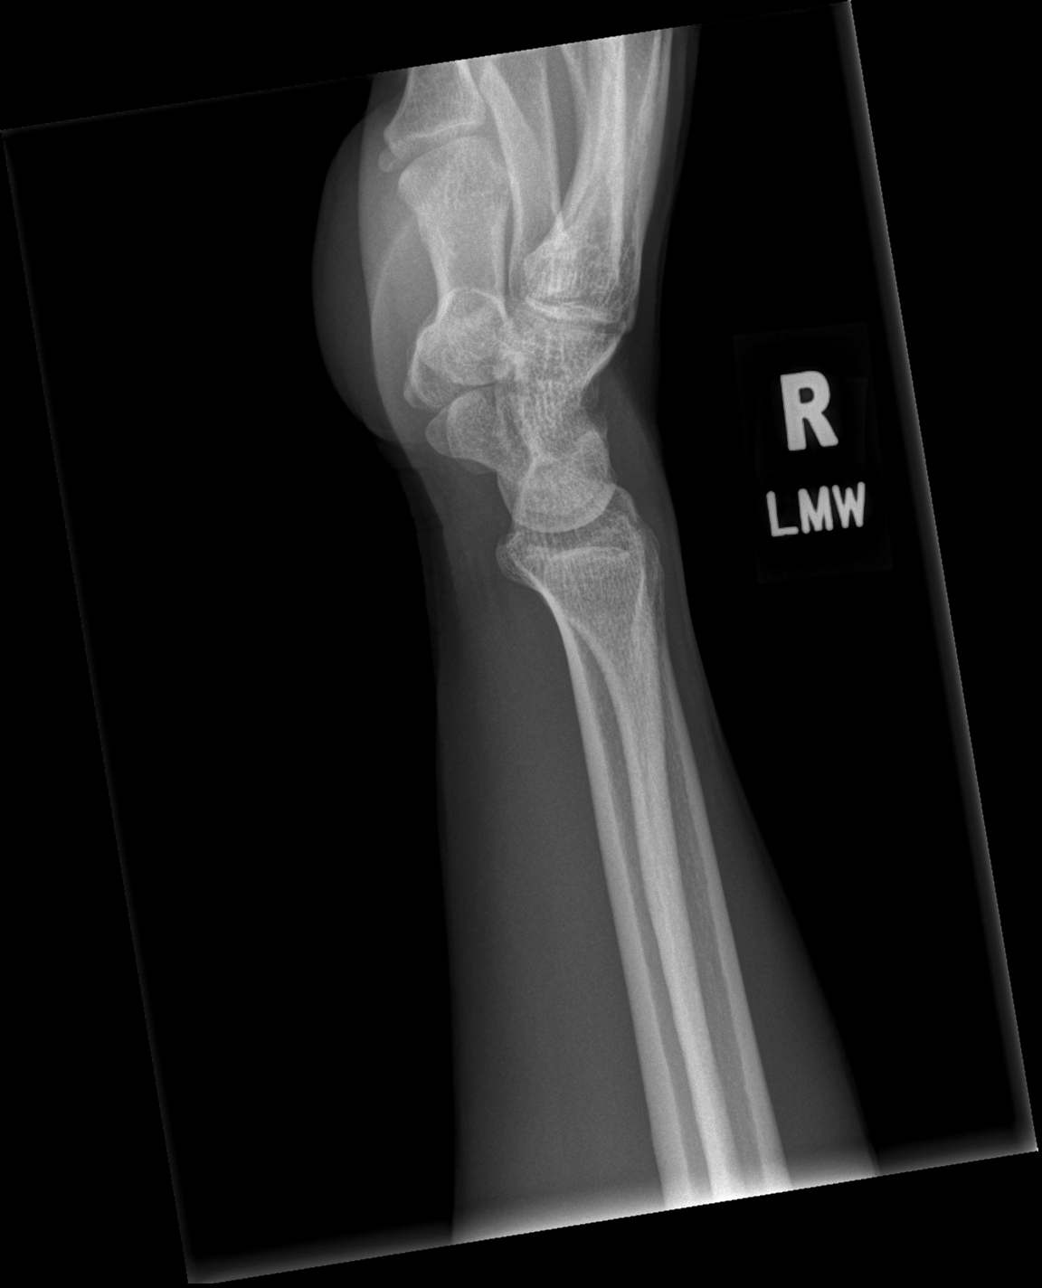
[im 4/4]
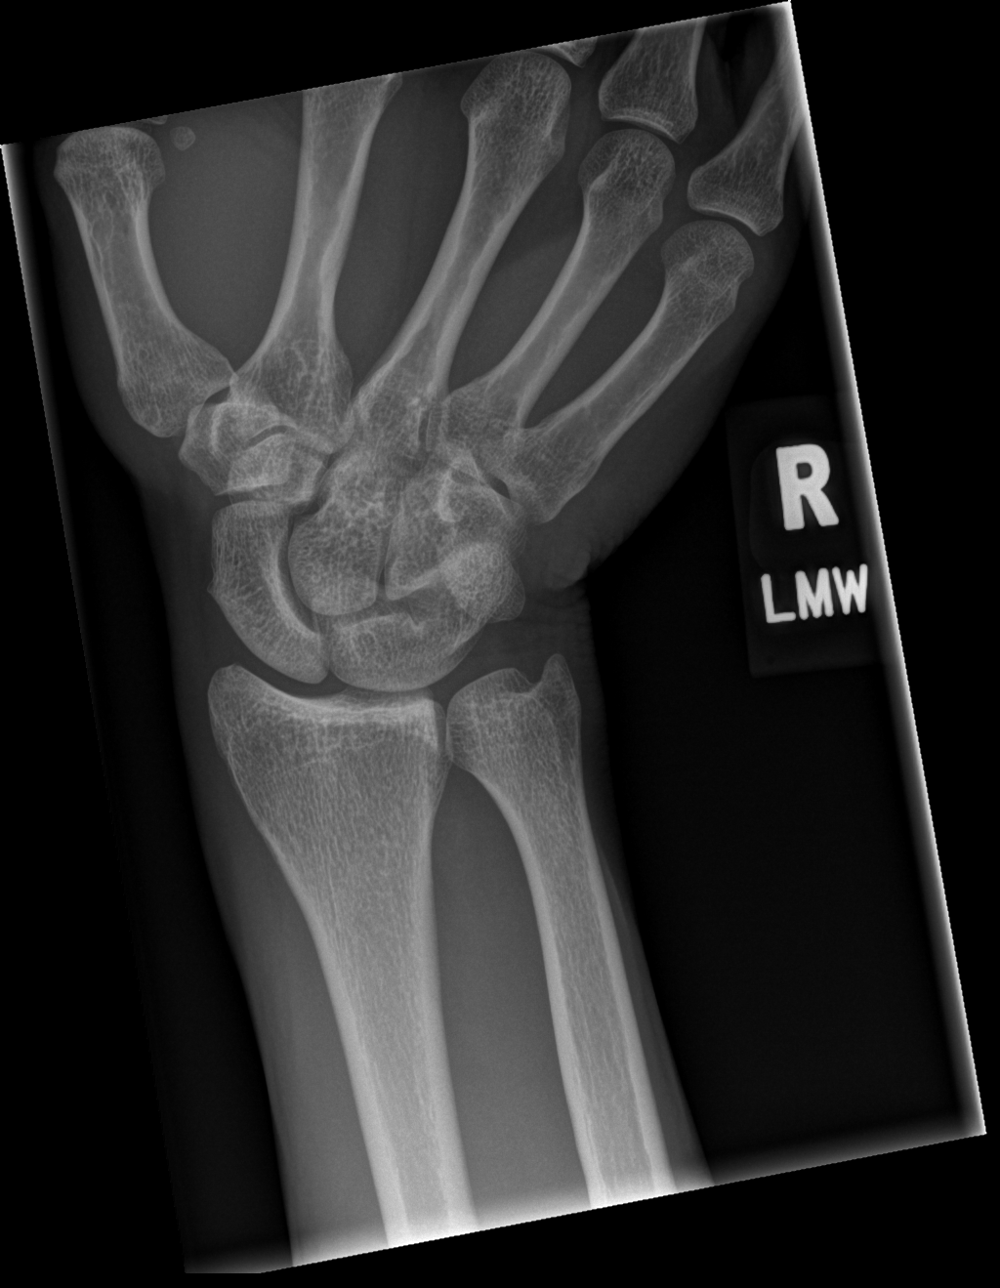

[4 of 4 positions shown; findings below may reference images not displayed]

FINDINGS: There is no evidence of fracture or dislocation. There is no
evidence of arthropathy or other focal bone abnormality. Soft
tissues are unremarkable.
IMPRESSION: Negative.

## 2023-08-24 ENCOUNTER — Ambulatory Visit: Payer: Self-pay

## 2023-09-04 ENCOUNTER — Encounter (HOSPITAL_COMMUNITY): Payer: Self-pay | Admitting: Emergency Medicine

## 2023-09-04 ENCOUNTER — Emergency Department (HOSPITAL_COMMUNITY)
Admission: EM | Admit: 2023-09-04 | Discharge: 2023-09-04 | Disposition: A | Discharge status: Home / Self Care | Payer: Self-pay | Attending: Emergency Medicine | Admitting: Emergency Medicine

## 2023-09-04 ENCOUNTER — Other Ambulatory Visit: Payer: Self-pay

## 2023-09-04 DIAGNOSIS — K529 Noninfective gastroenteritis and colitis, unspecified: Secondary | ICD-10-CM | POA: Insufficient documentation

## 2023-09-04 DIAGNOSIS — E860 Dehydration: Secondary | ICD-10-CM | POA: Insufficient documentation

## 2023-09-04 LAB — CBC
HCT: 43.8 % (ref 39.0–52.0)
Hemoglobin: 14.1 g/dL (ref 13.0–17.0)
MCH: 31.3 pg (ref 26.0–34.0)
MCHC: 32.2 g/dL (ref 30.0–36.0)
MCV: 97.3 fL (ref 80.0–100.0)
Platelets: 266 K/uL (ref 150–400)
RBC: 4.5 MIL/uL (ref 4.22–5.81)
RDW: 13.5 % (ref 11.5–15.5)
WBC: 5.8 K/uL (ref 4.0–10.5)
nRBC: 0 % (ref 0.0–0.2)

## 2023-09-04 LAB — URINALYSIS, ROUTINE W REFLEX MICROSCOPIC
Bacteria, UA: NONE SEEN
Bilirubin Urine: NEGATIVE
Glucose, UA: NEGATIVE mg/dL
Hgb urine dipstick: NEGATIVE
Ketones, ur: NEGATIVE mg/dL
Nitrite: NEGATIVE
Protein, ur: NEGATIVE mg/dL
Specific Gravity, Urine: 1.018 (ref 1.005–1.030)
pH: 5 (ref 5.0–8.0)

## 2023-09-04 LAB — COMPREHENSIVE METABOLIC PANEL WITH GFR
ALT: 20 U/L (ref 0–44)
AST: 26 U/L (ref 15–41)
Albumin: 3.8 g/dL (ref 3.5–5.0)
Alkaline Phosphatase: 67 U/L (ref 38–126)
Anion gap: 8 (ref 5–15)
BUN: 12 mg/dL (ref 6–20)
CO2: 24 mmol/L (ref 22–32)
Calcium: 9 mg/dL (ref 8.9–10.3)
Chloride: 107 mmol/L (ref 98–111)
Creatinine, Ser: 0.75 mg/dL (ref 0.61–1.24)
GFR, Estimated: >60 mL/min (ref >60)
Glucose, Bld: 104 mg/dL — ABNORMAL HIGH (ref 70–99)
Potassium: 4.3 mmol/L (ref 3.5–5.1)
Sodium: 139 mmol/L (ref 135–145)
Total Bilirubin: 0.9 mg/dL (ref 0.0–1.2)
Total Protein: 7 g/dL (ref 6.5–8.1)

## 2023-09-04 MED ORDER — LOPERAMIDE HCL 2 MG PO CAPS: 2.0000 mg | ORAL_CAPSULE | Freq: Four times a day (QID) | ORAL | 0 refills | Status: AC | PRN

## 2023-09-04 MED ORDER — ONDANSETRON 4 MG PO TBDP: 4.0000 mg | ORAL_TABLET | Freq: Three times a day (TID) | ORAL | 0 refills | Status: AC | PRN

## 2023-09-04 NOTE — Discharge Instructions (Signed)
 You were seen in the ER today for concerns of diarrhea. Your labs were thankfully reassuring with no signs of significant dehydration at this time. I have started you on Zofran  for nausea and Imodium  for diarrhea as needed. Please return to the ER for any concerns of new or worsening symptoms.

## 2023-09-04 NOTE — ED Provider Notes (Signed)
 [place] EMERGENCY DEPARTMENT AT [facility] Provider Note   CSN: [id number] Arrival date & time: 09/04/23  0143     Patient presents with: Diarrhea and Abdominal Pain   Phillip Barr is a 38 y.o. male.  Patient without significant medical history presents to the emergency department with concerns of diarrhea and abdominal discomfort.  Reports has been ongoing for about 4 days and briefly had some vomiting initially but the vomiting has resolved.  States that he is trying to aggressively hydrate at home.  Unclear if she maybe had contact with anyone who had some form of infectious diarrhea or if this is possibly food related.  Denies any food sensitivity.  No abdominal medical conditions such as IBS, UC, or Crohn's disease.  Denies bloody diarrhea, fevers, or bodyaches.   Diarrhea Associated symptoms: abdominal pain   Abdominal Pain Associated symptoms: diarrhea        Prior to Admission medications   Medication Sig Start Date End Date Taking? Authorizing Provider  loperamide  (IMODIUM ) 2 MG capsule Take 1 capsule (2 mg total) by mouth 4 (four) times daily as needed for diarrhea or loose stools. 09/04/23  Yes Lyndsie Wallman A, PA-C  ondansetron  (ZOFRAN -ODT) 4 MG disintegrating tablet Take 1 tablet (4 mg total) by mouth every 8 (eight) hours as needed for nausea or vomiting. 09/04/23  Yes Kayton Dunaj A, PA-C  cetirizine  (ZYRTEC ) 5 MG tablet Take 1 tablet (5 mg total) by mouth daily for 15 days. 05/31/20 06/15/20  Menshew, Candida LULLA Kings, PA-C  omeprazole  (PRILOSEC) 40 MG capsule Take 1 capsule (40 mg total) by mouth daily. 12/03/22 03/03/23  Malvina Alm ONEIDA, MD  pantoprazole  (PROTONIX ) 40 MG tablet Take 1 tablet (40 mg total) by mouth daily. 03/17/21 03/17/22  Malinda, Paul F, MD    Allergies: Patient has no known allergies.    Review of Systems  Gastrointestinal:  Positive for abdominal pain and diarrhea.  All other systems reviewed and are negative.   Updated Vital  Signs BP 111/73   Pulse (!) 57   Temp 98.6 F (37 C) (Oral)   Resp 18   SpO2 99%   Physical Exam Vitals and nursing note reviewed.  Constitutional:      General: He is not in acute distress.    Appearance: He is well-developed.  HENT:     Head: Normocephalic and atraumatic.  Eyes:     Conjunctiva/sclera: Conjunctivae normal.  Cardiovascular:     Rate and Rhythm: Normal rate and regular rhythm.     Heart sounds: No murmur heard. Pulmonary:     Effort: Pulmonary effort is normal. No respiratory distress.     Breath sounds: Normal breath sounds.  Abdominal:     General: Abdomen is flat. There is no distension.     Palpations: Abdomen is soft.     Tenderness: There is no abdominal tenderness. There is no guarding or rebound.  Musculoskeletal:        General: No swelling.     Cervical back: Neck supple.  Skin:    General: Skin is warm and dry.     Capillary Refill: Capillary refill takes less than 2 seconds.  Neurological:     Mental Status: He is alert.  Psychiatric:        Mood and Affect: Mood normal.     (all labs ordered are listed, but only abnormal results are displayed) Labs Reviewed  COMPREHENSIVE METABOLIC PANEL WITH GFR - Abnormal; Notable for the following components:  Result Value   Glucose, Bld 104 (*)    All other components within normal limits  CBC  URINALYSIS, ROUTINE W REFLEX MICROSCOPIC    EKG: None  Radiology: No results found.   Procedures   Medications Ordered in the ED - No data to display                                  Medical Decision Making  This patient presents to the ED for concern of diarrhea, abdominal pain.  Differential diagnosis includes gastroenteritis, bowel obstruction, dehydration, medication reaction   Lab Tests:  I Ordered, and personally interpreted labs.  The pertinent results include: CBC unremarkable with no leukocytosis, CMP shows minimally signs of dehydration without any AKI and only mild  hyperglycemia at 104   Problem List / ED Course:  Patient presents to the emergency department today with concerns of diarrhea and abdominal discomfort.  Reports the symptoms have been ongoing for the last 4 days or so with initial vomiting that is now resolved.  States that he has some discomfort in his abdomen but primarily around times that he has to have diarrhea but reports that he feels the diarrhea is slowly improving.  Denies any sick contact but does report that he frequently eats on the road and gas stations and is unsure if maybe he ate them and that was bad for him.  Denies any food allergies or sensitivities. On exam, patient has no abdominal tenderness.  Bowel sounds are slightly increased.  Cap refill less than 2 seconds indicating low concern for dehydration.  Patient's vitals are reassuring with slight bradycardia but patient was resting when these vitals were taken.  Blood pressure unremarkable and normal. Based on labs and presentation, low concern at this time for severe signs of dehydration or acute surgical abdomen given no focal abdominal tenderness and normal bowel sounds.  I believe the patient would likely benefit from starting on Imodium  for diarrhea and Zofran  for nausea.  Medication sent to pharmacy.  Strict return precautions discussed.  Otherwise stable for outpatient follow-up and discharged home.   Social Determinants of Health:  None  Final diagnoses:  Gastroenteritis    ED Discharge Orders          Ordered    ondansetron  (ZOFRAN -ODT) 4 MG disintegrating tablet  Every 8 hours PRN        09/04/23 0651    loperamide  (IMODIUM ) 2 MG capsule  4 times daily PRN        09/04/23 0651               Alys Dulak A, PA-C 09/04/23 0703    Dreama Longs, MD 09/04/23 0900

## 2023-09-04 NOTE — ED Triage Notes (Signed)
 Pt reports abd pain & diarrhea that begun yesterday. No N/V.

## 2024-01-25 ENCOUNTER — Ambulatory Visit: Payer: Self-pay

## 2024-01-29 ENCOUNTER — Other Ambulatory Visit: Payer: Self-pay

## 2024-01-29 ENCOUNTER — Emergency Department: Payer: Self-pay

## 2024-01-29 ENCOUNTER — Emergency Department
Admission: EM | Admit: 2024-01-29 | Discharge: 2024-01-29 | Disposition: A | Discharge status: Home / Self Care | Payer: Self-pay | Attending: Emergency Medicine | Admitting: Emergency Medicine

## 2024-01-29 DIAGNOSIS — M62830 Muscle spasm of back: Secondary | ICD-10-CM

## 2024-01-29 DIAGNOSIS — M62838 Other muscle spasm: Secondary | ICD-10-CM | POA: Insufficient documentation

## 2024-01-29 MED ORDER — LIDOCAINE 5 % EX PTCH: 1.0000 | MEDICATED_PATCH | CUTANEOUS | 0 refills | Status: AC

## 2024-01-29 MED ORDER — METHOCARBAMOL 500 MG PO TABS: 500.0000 mg | ORAL_TABLET | Freq: Three times a day (TID) | ORAL | 0 refills | Status: AC | PRN

## 2024-01-29 MED ORDER — IBUPROFEN 800 MG PO TABS: 800.0000 mg | ORAL_TABLET | Freq: Three times a day (TID) | ORAL | 0 refills | Status: AC | PRN

## 2024-01-29 MED ORDER — KETOROLAC TROMETHAMINE 30 MG/ML IJ SOLN
60.0000 mg | Freq: Once | INTRAMUSCULAR | Status: AC
  Administered 2024-01-29: 60 mg via INTRAMUSCULAR
  Filled 2024-01-29: qty 2

## 2024-01-29 MED ORDER — LIDOCAINE 5 % EX PTCH
1.0000 | MEDICATED_PATCH | Freq: Once | CUTANEOUS | Status: DC
  Administered 2024-01-29: 1 via TRANSDERMAL
  Filled 2024-01-29: qty 1

## 2024-01-29 NOTE — Discharge Instructions (Addendum)
 You may alternate over the counter Tylenol  1000 mg every 6 hours as needed for pain, fever and Ibuprofen  800 mg every 6-8 hours as needed for pain, fever.  Please take Ibuprofen  with food.  Do not take more than 4000 mg of Tylenol  (acetaminophen ) in a 24 hour period.   Alternating heat and ice to your left shoulder, gentle massage and stretching can help with pain.

## 2024-01-29 NOTE — ED Provider Notes (Signed)
 "  [facility] Provider Note    Event Date/Time   First MD Initiated Contact with Patient 01/29/24 2311     (approximate)   History   Shoulder Pain (L)   HPI  Phillip Barr is a 39 y.o. male with no significant past medical history who woke up with pain in his left shoulder and trapezius muscle 2 days ago.   Slept on couch at GF house 2 days ago.  Thinks he may have slept on this area wrong. Woke up tight left shoulder RHD No injury No numbness, weakness Able to ambulate. No bowel or bladder incontinence.  History provided by patient.    No past medical history on file.  Past Surgical History:  Procedure Laterality Date   BACK SURGERY      MEDICATIONS:  Prior to Admission medications  Medication Sig Start Date End Date Taking? Authorizing Provider  cetirizine  (ZYRTEC ) 5 MG tablet Take 1 tablet (5 mg total) by mouth daily for 15 days. 05/31/20 06/15/20  Menshew, Candida LULLA Kings, PA-C  loperamide  (IMODIUM ) 2 MG capsule Take 1 capsule (2 mg total) by mouth 4 (four) times daily as needed for diarrhea or loose stools. 09/04/23   Zelaya, Oscar A, PA-C  omeprazole  (PRILOSEC) 40 MG capsule Take 1 capsule (40 mg total) by mouth daily. 12/03/22 03/03/23  Malvina Alm ONEIDA, MD  ondansetron  (ZOFRAN -ODT) 4 MG disintegrating tablet Take 1 tablet (4 mg total) by mouth every 8 (eight) hours as needed for nausea or vomiting. 09/04/23   Zelaya, Oscar A, PA-C  pantoprazole  (PROTONIX ) 40 MG tablet Take 1 tablet (40 mg total) by mouth daily. 03/17/21 03/17/22  Alain Deward FALCON, MD    Physical Exam   Triage Vital Signs: ED Triage Vitals  Encounter Vitals Group     BP 01/29/24 1908 (!) 128/96     Girls Systolic BP Percentile --      Girls Diastolic BP Percentile --      Boys Systolic BP Percentile --      Boys Diastolic BP Percentile --      Pulse Rate 01/29/24 1908 78     Resp 01/29/24 1908 14     Temp 01/29/24 1908 98.1 F (36.7 C)     Temp Source 01/29/24 1908  Oral     SpO2 01/29/24 1908 100 %     Weight --      Height --      Head Circumference --      Peak Flow --      Pain Score 01/29/24 1907 4     Pain Loc --      Pain Education --      Exclude from Growth Chart --     Most recent vital signs: Vitals:   01/29/24 1908 01/29/24 2347  BP: (!) 128/96 132/81  Pulse: 78 (!) 59  Resp: 14 18  Temp: 98.1 F (36.7 C) 98.2 F (36.8 C)  SpO2: 100% 98%     CONSTITUTIONAL: Alert and responds appropriately to questions. Well-appearing; well-nourished HEAD: Normocephalic, atraumatic EYES: Conjunctivae clear, pupils appear equal ENT: normal nose; moist mucous membranes NECK: Normal range of motion, no midline spinal tenderness or step-off or deformity.  Tender over the left trapezius muscle which reproduces pain without overlying skin changes. CARD: Regular rate and rhythm RESP: Normal chest excursion without splinting or tachypnea; no hypoxia or respiratory distress, speaking full sentences ABD/GI: non-distended EXT: Normal ROM in all joints, no major deformities noted, no bony  deformity in the left shoulder, 2+ left radial pulse, compartments of the left arm are soft SKIN: Normal color for age and race, no rashes on exposed skin NEURO: Moves all extremities equally, normal speech, no facial asymmetry noted, normal grip strength in the left hand, normal gait, normal sensation of the left arm PSYCH: The patient's mood and manner are appropriate. Grooming and personal hygiene are appropriate.  ED Results / Procedures / Treatments   LABS: (all labs ordered are listed, but only abnormal results are displayed) Labs Reviewed - No data to display   EKG:   RADIOLOGY: My personal review and interpretation of imaging: Negative x-ray of the left shoulder.  I have personally reviewed all radiology reports. DG Shoulder Left Result Date: 01/29/2024 CLINICAL DATA:  Left shoulder pain. EXAM: LEFT SHOULDER - 2+ VIEW COMPARISON:  None Available.  FINDINGS: There is no evidence of fracture or dislocation. There is no evidence of arthropathy or other focal bone abnormality. Postoperative changes are seen within the visualized portion of the mid thoracic spine. Soft tissues are unremarkable. IMPRESSION: Negative. Electronically Signed   By: Suzen Dials M.D.   On: 01/29/2024 19:39     PROCEDURES:  Critical Care performed: No   CRITICAL CARE Performed by: Josette Deliyah Muckle   Total critical care time: 0 minutes  Critical care time was exclusive of separately billable procedures and treating other patients.  Critical care was necessary to treat or prevent imminent or life-threatening deterioration.  Critical care was time spent personally by me on the following activities: development of treatment plan with patient and/or surrogate as well as nursing, discussions with consultants, evaluation of patient's response to treatment, examination of patient, obtaining history from patient or surrogate, ordering and performing treatments and interventions, ordering and review of laboratory studies, ordering and review of radiographic studies, pulse oximetry and re-evaluation of patient's condition.   Procedures    IMPRESSION / MDM / ASSESSMENT AND PLAN / ED COURSE  I reviewed the triage vital signs and the nursing notes.   Patient here with left trapezius muscle strain, spasm.     DIFFERENTIAL DIAGNOSIS (includes but not limited to):   Trapezius muscle strain, trapezius muscle spasm, doubt cervical myelopathy, epidural abscess or hematoma, discitis or osteomyelitis, fracture, dislocation.  I do not think this is his anginal equivalent.  Patient's presentation is most consistent with acute complicated illness / injury requiring diagnostic workup.  PLAN: X-ray of left shoulder obtained from triage and reviewed/interpreted by myself and radiologist and shows no acute abnormality.  Pain seems to be mostly over the left trapezius muscle.   Recommended alternating Tylenol , Motrin , stretching, alternating heat and ice, gentle massage.  Patient agreeable to this plan.  Will give Toradol  here and discharged with anti-inflammatories, muscle relaxers.   MEDICATIONS GIVEN IN ED: Medications  ketorolac  (TORADOL ) 30 MG/ML injection 60 mg (60 mg Intramuscular Given 01/29/24 2339)     ED COURSE:  At this time, I do not feel there is any life-threatening condition present. I reviewed all nursing notes, vitals, pertinent previous records.  All lab and urine results, EKGs, imaging ordered have been independently reviewed and interpreted by myself.  I reviewed all available radiology reports from any imaging ordered this visit.  Based on my assessment, I feel the patient is safe to be discharged home without further emergent workup and can continue workup as an outpatient as needed. Discussed all findings, treatment plan as well as usual and customary return precautions.  They verbalize understanding and  are comfortable with this plan.  Outpatient follow-up has been provided as needed.  All questions have been answered.    CONSULTS:  none   OUTSIDE RECORDS REVIEWED: Reviewed most recent health department note.     FINAL CLINICAL IMPRESSION(S) / ED DIAGNOSES   Final diagnoses:  Spasm of left trapezius muscle     Rx / DC Orders   ED Discharge Orders          Ordered    ibuprofen  (ADVIL ) 800 MG tablet  Every 8 hours PRN        01/29/24 2332    methocarbamol  (ROBAXIN ) 500 MG tablet  Every 8 hours PRN        01/29/24 2332    lidocaine  (LIDODERM ) 5 %  Every 24 hours        01/29/24 2332             Note:  This document was prepared using Dragon voice recognition software and may include unintentional dictation errors.   Greysen Devino, Josette SAILOR, DO 01/30/24 [phone]  "

## 2024-01-29 NOTE — ED Triage Notes (Signed)
 Pt reports waking up with left shoulder pain. Wants to be seen before returning to work. Mobility intact.
